# Patient Record
Sex: Male | Born: 1980 | Race: White | Hispanic: No | Marital: Single | Smoking: Former smoker
Health system: Southern US, Community
[De-identification: ages and names within clinical notes are randomized; demographics above are authoritative.]

## PROBLEM LIST (undated history)

## (undated) DIAGNOSIS — I10 Essential (primary) hypertension: Secondary | ICD-10-CM

## (undated) DIAGNOSIS — R569 Unspecified convulsions: Secondary | ICD-10-CM

## (undated) HISTORY — DX: Unspecified convulsions: R56.9

## (undated) HISTORY — PX: ROTATOR CUFF REPAIR: SHX139

## (undated) HISTORY — PX: SHOULDER SURGERY: SHX246

---
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
## 2002-09-14 ENCOUNTER — Encounter: Payer: Self-pay | Admitting: Emergency Medicine

## 2002-09-14 ENCOUNTER — Emergency Department (HOSPITAL_COMMUNITY): Admission: EM | Admit: 2002-09-14 | Discharge: 2002-09-14 | Payer: Self-pay | Admitting: Podiatry

## 2005-03-27 ENCOUNTER — Emergency Department (HOSPITAL_COMMUNITY): Admission: EM | Admit: 2005-03-27 | Discharge: 2005-03-27 | Payer: Self-pay | Admitting: Emergency Medicine

## 2006-01-22 ENCOUNTER — Emergency Department (HOSPITAL_COMMUNITY): Admission: EM | Admit: 2006-01-22 | Discharge: 2006-01-23 | Payer: Self-pay | Admitting: Emergency Medicine

## 2006-01-29 ENCOUNTER — Ambulatory Visit (HOSPITAL_COMMUNITY): Admission: RE | Admit: 2006-01-29 | Discharge: 2006-01-29 | Payer: Self-pay | Admitting: Specialist

## 2006-03-27 ENCOUNTER — Encounter: Admission: RE | Admit: 2006-03-27 | Discharge: 2006-05-18 | Payer: Self-pay | Admitting: Specialist

## 2011-06-10 ENCOUNTER — Emergency Department (HOSPITAL_COMMUNITY): Payer: Managed Care, Other (non HMO)

## 2011-06-10 ENCOUNTER — Emergency Department (HOSPITAL_COMMUNITY)
Admission: EM | Admit: 2011-06-10 | Discharge: 2011-06-10 | Disposition: A | Discharge status: Home / Self Care | Payer: Managed Care, Other (non HMO) | Attending: Emergency Medicine | Admitting: Emergency Medicine

## 2011-06-10 ENCOUNTER — Other Ambulatory Visit: Payer: Self-pay

## 2011-06-10 ENCOUNTER — Encounter (HOSPITAL_COMMUNITY): Payer: Self-pay

## 2011-06-10 DIAGNOSIS — I10 Essential (primary) hypertension: Secondary | ICD-10-CM | POA: Insufficient documentation

## 2011-06-10 DIAGNOSIS — R51 Headache: Secondary | ICD-10-CM | POA: Insufficient documentation

## 2011-06-10 DIAGNOSIS — R296 Repeated falls: Secondary | ICD-10-CM | POA: Insufficient documentation

## 2011-06-10 DIAGNOSIS — R42 Dizziness and giddiness: Secondary | ICD-10-CM | POA: Insufficient documentation

## 2011-06-10 DIAGNOSIS — S0990XA Unspecified injury of head, initial encounter: Secondary | ICD-10-CM

## 2011-06-10 DIAGNOSIS — Y9229 Other specified public building as the place of occurrence of the external cause: Secondary | ICD-10-CM | POA: Insufficient documentation

## 2011-06-10 DIAGNOSIS — S0100XA Unspecified open wound of scalp, initial encounter: Secondary | ICD-10-CM | POA: Insufficient documentation

## 2011-06-10 DIAGNOSIS — S0101XA Laceration without foreign body of scalp, initial encounter: Secondary | ICD-10-CM

## 2011-06-10 DIAGNOSIS — Y990 Civilian activity done for income or pay: Secondary | ICD-10-CM | POA: Insufficient documentation

## 2011-06-10 DIAGNOSIS — R55 Syncope and collapse: Secondary | ICD-10-CM

## 2011-06-10 DIAGNOSIS — M79609 Pain in unspecified limb: Secondary | ICD-10-CM | POA: Insufficient documentation

## 2011-06-10 HISTORY — DX: Essential (primary) hypertension: I10

## 2011-06-10 LAB — CK TOTAL AND CKMB (NOT AT ARMC): Relative Index: 1.7 (ref 0.0–2.5)

## 2011-06-10 LAB — DIFFERENTIAL
Basophils Relative: 0 % (ref 0–1)
Eosinophils Absolute: 0.2 10*3/uL (ref 0.0–0.7)
Lymphs Abs: 1.5 10*3/uL (ref 0.7–4.0)
Monocytes Absolute: 0.6 10*3/uL (ref 0.1–1.0)
Monocytes Relative: 5 % (ref 3–12)
Neutrophils Relative %: 82 % — ABNORMAL HIGH (ref 43–77)

## 2011-06-10 LAB — CBC
HCT: 42.8 % (ref 39.0–52.0)
Hemoglobin: 15.1 g/dL (ref 13.0–17.0)
MCH: 29.8 pg (ref 26.0–34.0)
MCHC: 35.3 g/dL (ref 30.0–36.0)
MCV: 84.4 fL (ref 78.0–100.0)
RBC: 5.07 MIL/uL (ref 4.22–5.81)

## 2011-06-10 LAB — BASIC METABOLIC PANEL
BUN: 15 mg/dL (ref 6–23)
Creatinine, Ser: 0.82 mg/dL (ref 0.50–1.35)
GFR calc non Af Amer: >90 mL/min (ref >90)
Glucose, Bld: 99 mg/dL (ref 70–99)
Potassium: 4.2 mEq/L (ref 3.5–5.1)

## 2011-06-10 LAB — D-DIMER, QUANTITATIVE: D-Dimer, Quant: <0.22 ug/mL-FEU (ref 0.00–0.48)

## 2011-06-10 LAB — GLUCOSE, CAPILLARY: Glucose-Capillary: 97 mg/dL (ref 70–99)

## 2011-06-10 LAB — RAPID URINE DRUG SCREEN, HOSP PERFORMED
Barbiturates: NOT DETECTED
Benzodiazepines: NOT DETECTED
Cocaine: NOT DETECTED
Tetrahydrocannabinol: NOT DETECTED

## 2011-06-10 MED ORDER — SODIUM CHLORIDE 0.9 % IV BOLUS (SEPSIS)
500.0000 mL | Freq: Once | INTRAVENOUS | Status: AC
  Administered 2011-06-10: 500 mL via INTRAVENOUS

## 2011-06-10 MED ORDER — OXYCODONE-ACETAMINOPHEN 5-325 MG PO TABS
2.0000 | ORAL_TABLET | Freq: Once | ORAL | Status: AC
  Administered 2011-06-10: 2 via ORAL
  Filled 2011-06-10: qty 2

## 2011-06-10 MED ORDER — TETANUS-DIPHTH-ACELL PERTUSSIS 5-2.5-18.5 LF-MCG/0.5 IM SUSP
0.5000 mL | Freq: Once | INTRAMUSCULAR | Status: AC
  Administered 2011-06-10: 0.5 mL via INTRAMUSCULAR
  Filled 2011-06-10 (×2): qty 0.5

## 2011-06-10 MED ORDER — HYDROCODONE-ACETAMINOPHEN 5-500 MG PO TABS
1.0000 | ORAL_TABLET | Freq: Four times a day (QID) | ORAL | Status: AC | PRN
Start: 2011-06-10 — End: 2011-06-20

## 2011-06-10 MED ORDER — FENTANYL CITRATE 0.05 MG/ML IJ SOLN
50.0000 ug | Freq: Once | INTRAMUSCULAR | Status: AC
  Administered 2011-06-10: 50 ug via INTRAVENOUS
  Filled 2011-06-10: qty 2

## 2011-06-10 NOTE — ED Provider Notes (Signed)
This patient was originally seen by Shickley PA. He is complaints of syncope, with head injury, and hand pain. Patient has been thoroughly worked up with CT head, CBC, BMP, sedimentation rate, x-rays, d-dimer. All which have returned with no acute findings. The patient hand pain is unexplainable. However, Raynauds, vasculitis, blood clot, brain injury, and other concerning differentials have been ruled out. The patient will be given pain medication and is to followup with his PCP. The patient's hand x-rays were negative as well. The mother and the patient is still very concerned about his hand pain. I have told him that he needs to followup with his primary care doctor and if he is still having symptoms his doctor can further refer him somewhere from there when he gets his staples taken out.  Dorthula Matas, PA 06/10/11 1645

## 2011-06-10 NOTE — ED Notes (Signed)
Pt brought in via EMS.  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  EMS reports laceration with a flap at back of head.  EMS reports that pt c/o some dizziness during EMS transport.

## 2011-06-10 NOTE — ED Provider Notes (Signed)
Medical screening examination/treatment/procedure(s) were conducted as a shared visit with non-physician practitioner(s) and myself.  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  C/o lightheadedness, flushed feeling, nausea.  No CP or SOB.  Laceration to occiput. EKG: no prolonged QT, no delta wave, no Brugada C/o bilateral hand pain: uncertain etiology.  +2 radial pulse, cap refill .<2s, strength nml. No bony deformity  Glynn Octave, MD 06/10/11 1747

## 2011-06-10 NOTE — ED Provider Notes (Signed)
History     CSN: [id number]  Arrival date & time 06/10/11  5621   First MD Initiated Contact with Patient 06/10/11 [phone]      Chief Complaint  Patient presents with  . 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 EMS reports that patient was found down on the floor of the freezer, unconscious by other employees. Per EMS patient was arousable by the other employees and was alert and oriented by their arrival. Patient states that he was standing in the freezer section and began to feel dizzy but that is the last thing he remembers before syncopizing. Patient denies history of similar events. EMS states the patient continued to complain of dizziness during EMS transport. Patient states he smokes half a pack of cigarettes a day and drinks 3-4 beers in the evening time. Patient denies recreational drug use. Patient is complaining of pain in posterior head with EMS reporting posterior scalp laceration. Patient is unsure of last tetanus. Patient denies visual changes, chest pain, neck pain, back pain, shortness of breath, extremity pain or injury, abdominal pain.  Past Medical History  Diagnosis Date  . Hypertension     Past Surgical History  Procedure Date  . Rotator cuff repair     No family history on file.  History  Substance Use Topics  . Smoking status: Current Everyday Smoker  . Smokeless tobacco: Not on file  . Alcohol Use: 8.4 oz/week    14 Cans of beer per week      Review of Systems  All other systems reviewed and are negative.    Allergies  Review of patient's allergies indicates no known allergies.  Home Medications  No current outpatient prescriptions on file.  BP 132/81  Pulse  73  Temp 98.7 F (37.1 C)  Resp 16  SpO2 95%  Physical Exam  Nursing note and vitals reviewed. Constitutional: He is oriented to person, place, and time. He appears well-developed and well-nourished. No distress. Cervical collar and backboard in place.  HENT:  Head: Normocephalic.       Four centimeter stellate laceration of lower posterior scalp. No underlying step-off.  Eyes: Conjunctivae and EOM are normal. Pupils are equal, round, and reactive to light.  Neck: Normal range of motion. Neck supple.  Cardiovascular: Normal rate, regular rhythm, normal heart sounds and intact distal pulses.  Exam reveals no gallop and no friction rub.   No murmur heard. Pulmonary/Chest: Effort normal and breath sounds normal. No respiratory distress. He has no wheezes. He has no rales. He exhibits no tenderness.  Abdominal: Bowel sounds are normal. He exhibits no distension and no mass. There is no tenderness. There is no rebound and no guarding.  Musculoskeletal: Normal range of motion. He exhibits tenderness. He exhibits no edema.       Tenderness to palpation across the entire dorsal aspect metacarpal joints of bilateral hands however no breaking skin, swelling, bruising, or deformity. Good radial pulse bilaterally and normal cap refill of all digits. Normal skin.  Full range of motion of bilateral upper and lower extremities without pain or difficulty with 5 out of 5 strength.  Entire spine nontender, pelvis stable.  Neurological: He is alert and oriented to person, place, and time. He has normal reflexes.  Skin:  Skin is warm and dry. No rash noted. He is not diaphoretic. No erythema.  Psychiatric: He has a normal mood and affect.    ED Course  Procedures (including critical care time)   IV fentanyl for pain  IM tetanus   Date: 06/10/2011  Rate: 85  Rhythm: normal sinus rhythm  QRS Axis: normal  Intervals: normal  ST/T Wave abnormalities: normal  Conduction Disutrbances:none  Narrative  Interpretation: inferior Q waves  Old EKG Reviewed: none available    LACERATION REPAIR Performed by: Jenness Corner Authorized by: Jenness Corner Consent: Verbal consent obtained. Risks and benefits: risks, benefits and alternatives were discussed Consent given by: patient Patient identity confirmed: provided demographic data Prepped and Draped in normal sterile fashion Wound explored  Laceration Location: lower posterior scalp  Laceration Length: 4cm. stellate  No Foreign Bodies seen or palpated  Anesthesia: local infiltration  Local anesthetic: lidocaine 2% with epinephrine  Anesthetic total: 8 ml  Irrigation method: syringe Amount of cleaning: standard  Skin closure: staples  Number of sutures: 7 staples  Technique: staples  Patient tolerance: Patient tolerated the procedure well with no immediate complications.  Labs Reviewed  CBC - Abnormal; Notable for the following:    WBC 13.0 (*)    All other components within normal limits  DIFFERENTIAL - Abnormal; Notable for the following:    Neutrophils Relative 82 (*)    Neutro Abs 10.7 (*)    All other components within normal limits  BASIC METABOLIC PANEL  ETHANOL  GLUCOSE, CAPILLARY  URINE RAPID DRUG SCREEN (HOSP PERFORMED)  POCT CBG MONITORING   No results found.   1. Syncope   2. Minor head injury   3. Scalp laceration     2:08 PM Patient is complaining of severe bilateral hand pain complaining of pain from "knuckles down" on both hands though there is no trauma to hands, good radial pulse bilaterally, normal cap refill and good grip strength. Unsure of origin of hand pain but no deformity or point specific TTP to suggest underlying fracture.   3:13 PM Given high degree of pain will order SED rate and continued to treat pain. Sign out given to Ellin Saba, physician assistant, you will continue to monitor labs with dispo pending patient's pain control and pending labs.  MDM  Patient is young and  healthy with no known medical problems with no origin of syncope within the ER. Negative chest x-ray and EKG and without suggestion of cardiac origin of syncope in no acute findings on head CT. Patient is alert and oriented with no neuro focal findings. EKG is not worrisome for underlying       Jenness Corner, PA 06/10/11 1513  Jenness Corner, PA 06/10/11 1521  Lenon Oms [place], [place] 06/10/11 1607

## 2011-06-21 NOTE — ED Provider Notes (Signed)
Evaluation and management procedures were performed by the PA/NP under my supervision/collaboration.    Felisa Bonier, MD 06/21/11 [phone]

## 2011-09-21 ENCOUNTER — Institutional Professional Consult (permissible substitution): Payer: Self-pay | Admitting: Cardiovascular Disease

## 2011-10-17 ENCOUNTER — Encounter: Payer: Self-pay | Admitting: *Deleted

## 2011-10-20 ENCOUNTER — Encounter: Payer: Self-pay | Admitting: Cardiovascular Disease

## 2011-10-20 ENCOUNTER — Ambulatory Visit (INDEPENDENT_AMBULATORY_CARE_PROVIDER_SITE_OTHER): Payer: 59 | Admitting: Cardiovascular Disease

## 2011-10-20 VITALS — BP 126/82 | HR 61 | Ht 71.5 in | Wt 173.8 lb

## 2011-10-20 DIAGNOSIS — R55 Syncope and collapse: Secondary | ICD-10-CM

## 2011-10-20 NOTE — Assessment & Plan Note (Addendum)
We are asked to see Ray Torres today for episodes of syncope. He had an episode of syncope back in January. He has seen a neurologist and has been diagnosed as having generalized seizures. He has been started on Keppra and is feeling better. He's not had any episodes of lightheadedness since that time.  His neurologist wanted to make sure that he had no cardiac issues.  We will place an event monitor on him. We will get an echocardiogram for evaluation of his left ventricular function. I'll see him on an as-needed basis.

## 2011-10-20 NOTE — Progress Notes (Signed)
    Ray Torres Date of Birth  July 14, 1980       [facility]    [place] [address]  [address] [place], [place]  [address]   [place], [place]  [address] [phone]     [phone]   Fax  [phone]    Fax [phone]  Problem List: Syncope  History of Present Illness:  Ray Torres is a 31 yo who present for evaluation of syncopal and pre syncopal episodes.  He works for [employer] in the [place] area. He loads and unloads cases of foods. He had an episode of syncope back in January while he was working.  He was cleared to go back to work and has now returned to his job. He has had some episodes of lightheadedness and presyncope. He's not had any further episodes of true syncope. He was seen by the neurologist and was thought to be having generalized seizures. He was sent to [place] to further evaluate his cardiac status.  He was started on Keppra and has not had any of the episodes of presyncope.  He's able to work very hard without any episodes of chest pain or shortness breath.    Current Outpatient Prescriptions on File Prior to Visit  Medication Sig Dispense Refill  . levETIRAcetam (KEPPRA) 500 MG tablet Take 500 mg by mouth 2 (two) times daily.        No Known Allergies  Past Medical History  Diagnosis Date  . Hypertension   . Seizures     Past Surgical History  Procedure Date  . Rotator cuff repair   . Shoulder surgery     both    History  Smoking status  . Current Everyday Smoker  Smokeless tobacco  . Not on file    History  Alcohol Use  . 8.4 oz/week  . 14 Cans of beer per week    History reviewed. No pertinent family history.  Reviw of Systems:  Reviewed in the HPI.  All other systems are negative.  Physical Exam: Blood pressure 126/82, pulse 61, height 5' 11.5" (1.816 m), weight 173 lb 12.8 oz (78.835 kg). General: Well developed, well nourished, in no acute distress.  Head:  Normocephalic, atraumatic, sclera non-icteric, mucus membranes are moist,   Neck: Supple. Carotids are 2 + without bruits. No JVD  Lungs: Clear bilaterally to auscultation.  Heart: regular rate.  normal  S1 S2. No murmurs, gallops or rubs.  Abdomen: Soft, non-tender, non-distended with normal bowel sounds. No hepatomegaly. No rebound/guarding. No masses.  Msk:  Strength and tone are normal  Extremities: No clubbing or cyanosis. No edema.  Distal pedal pulses are 2+ and equal bilaterally.  Neuro: Alert and oriented X 3. Moves all extremities spontaneously.  Psych:  Responds to questions appropriately with a normal affect.  ECG: 10/20/2011--normal sinus rhythm at 61 beats per minute. He has no ST or T wave changes.  Assessment / Plan:

## 2011-10-20 NOTE — Patient Instructions (Signed)
Your physician has recommended that you wear an event monitor. Event monitors are medical devices that record the heart's electrical activity. Doctors most often us these monitors to diagnose arrhythmias. Arrhythmias are problems with the speed or rhythm of the heartbeat. The monitor is a small, portable device. You can wear one while you do your normal daily activities. This is usually used to diagnose what is causing palpitations/syncope (passing out).  Your physician has requested that you have an echocardiogram. Echocardiography is a painless test that uses sound waves to create images of your heart. It provides your doctor with information about the size and shape of your heart and how well your heart's chambers and valves are working. This procedure takes approximately one hour. There are no restrictions for this procedure.  Your physician recommends that you schedule a follow-up appointment in: AS NEEDED BASIS    

## 2011-10-24 ENCOUNTER — Encounter (INDEPENDENT_AMBULATORY_CARE_PROVIDER_SITE_OTHER): Payer: 59

## 2011-10-24 ENCOUNTER — Other Ambulatory Visit: Payer: Self-pay

## 2011-10-24 ENCOUNTER — Ambulatory Visit (HOSPITAL_COMMUNITY): Payer: 59 | Attending: Cardiovascular Disease

## 2011-10-24 DIAGNOSIS — R55 Syncope and collapse: Secondary | ICD-10-CM | POA: Insufficient documentation

## 2011-10-24 DIAGNOSIS — R42 Dizziness and giddiness: Secondary | ICD-10-CM | POA: Insufficient documentation

## 2011-10-24 DIAGNOSIS — R569 Unspecified convulsions: Secondary | ICD-10-CM | POA: Insufficient documentation

## 2011-10-24 DIAGNOSIS — I10 Essential (primary) hypertension: Secondary | ICD-10-CM | POA: Insufficient documentation

## 2011-10-24 NOTE — Progress Notes (Signed)
Echocardiogram performed.  

## 2011-10-25 ENCOUNTER — Telehealth: Payer: Self-pay | Admitting: Cardiovascular Disease

## 2011-10-25 NOTE — Telephone Encounter (Signed)
Pt aware of echo results 

## 2011-10-25 NOTE — Telephone Encounter (Signed)
Fu call °Patient returning your call °

## 2011-11-28 ENCOUNTER — Telehealth: Payer: Self-pay | Admitting: *Deleted

## 2011-11-28 NOTE — Telephone Encounter (Signed)
msg left / holter NSR occasional episodes of sinus brady pt to call with questions number provided

## 2012-10-28 ENCOUNTER — Other Ambulatory Visit: Payer: Self-pay | Admitting: Diagnostic Neuroimaging

## 2012-10-30 ENCOUNTER — Encounter: Payer: Self-pay | Admitting: Diagnostic Neuroimaging

## 2012-10-30 ENCOUNTER — Ambulatory Visit (INDEPENDENT_AMBULATORY_CARE_PROVIDER_SITE_OTHER): Payer: Managed Care, Other (non HMO) | Admitting: Diagnostic Neuroimaging

## 2012-10-30 VITALS — BP 144/78 | HR 72 | Temp 98.5°F | Ht 72.0 in | Wt 156.0 lb

## 2012-10-30 DIAGNOSIS — G40909 Epilepsy, unspecified, not intractable, without status epilepticus: Secondary | ICD-10-CM

## 2012-10-30 MED ORDER — LEVETIRACETAM 500 MG PO TABS: 500.0000 mg | ORAL_TABLET | Freq: Two times a day (BID) | ORAL | Status: DC

## 2012-10-30 NOTE — Progress Notes (Signed)
[facility]  PATIENT: Ray Torres DOB: December 16, 1980  REFERRING CLINICIAN:  HISTORY FROM: patient  REASON FOR VISIT: follow up   HISTORICAL  CHIEF COMPLAINT:  Chief Complaint  Patient presents with  . Follow-up    HISTORY OF PRESENT ILLNESS:   UPDATE 10/30/12: since last visit, patient is doing well. No further seizures. Last seizure January 2013. Also since last visit, I have now started to take care of his twin brother for seizure disorder, previously treated by one of my former colleagues (Dr. Sandria Manly). Has slowed down on her and we and heat and in a   UPDATE 05/03/12:  Denies any seizure or passing out episodes.  Tolerating LEV 500mg  BID well.  He continues to drink 6-8 beers per day.  Has drank less in the last 2-3 days secondary to having a "cold".   UPDATE 01/02/12:  Back at work since August 6th.  After taking am medication has light headedness, he has been taking on empty stomach.  Has been feeling lightheaded after awakening for about 1-2 hours then resolves.  Tolerating LEV 500mg  BID.   Denies any episodes of seizures or passing out.    UPDATE 10/02/11:  Doing well.  Tolerating Lev 500mg  BID without drowsiness or nausea.  Reports taking his Felicity Coyer later in the day and felt light headed but did not pass out.  Reports having tingling to left chest and hand intermittently, daily, this was occuring prior to his medications.  He missed his appointment with the cardiologist and is rescheduled for May 31st.  Currently out of work, he drives a Scientist, clinical (histocompatibility and immunogenetics).  His short term/long term disability forms have been completed by [facility].  He continues to drink 4-8 alcoholic beverages per day.  Last reported episode with passing out was 06/19/11.  PRIOR HPI (Dr. Marjory Lies): 32 year old right-handed Caucasian male with no significant past medical history here for new onset dizziness and passing out episode. In January 2013 while at work he had an episode where he was unable  to comprehend information someone was telling him and he passed out, he hit the back of his head that required 7-8 staples.  He was unable to remember how long he was not responsive approximately one to 2 minutes. He reports his coworkers said he was trying to fight them. He did not know his birthday upon awakening. He denies any tongue biting, incontinence of bowel or bladder or convulsive activity.  Reports his hands were aching for about 2 days afterwards.  His mom reports he seen a while the emergency department. It took him to do 3 days before he felt better.One-week prior to this episode he had an upper respiratory infection. Denies lack of sleep, poor diet Denies any muscle aches or feeling tired. He also describes a second episode about one month later of feeling lightheaded and difficulty comprehending but did not pass out.   He was able to avoid  passing out. He has a history of alcohol and drug abuse, continues to drink approximately 4-8 beers per day. He denies interruptions of his drinking pattern. He reports he has frequent staring off spells but denies is able to respond with someone speaks to him.  He has had multiple deja vu episodes.  Denies shortness of breath, chest pain but has fast heart beat when he becomes anxious.  Sleeps approximately 7 hours per night.    His mother and his fraternal twin brother both have a history of epilepsy (grand mal seizures) at  the age of 30 years old.  REVIEW OF SYSTEMS: Full 14 system review of systems performed and notable only for nothing.  ALLERGIES: No Known Allergies  HOME MEDICATIONS: Outpatient Prescriptions Prior to Visit  Medication Sig Dispense Refill  . levETIRAcetam (KEPPRA) 500 MG tablet TAKE  (1)  TABLET TWICE A DAY.  180 tablet  1   No facility-administered medications prior to visit.    PAST MEDICAL HISTORY: Past Medical History  Diagnosis Date  . Hypertension   . Seizures     PAST SURGICAL HISTORY: Past Surgical History    Procedure Laterality Date  . Rotator cuff repair    . Shoulder surgery      both    FAMILY HISTORY: Family History  Problem Relation Age of Onset  . Epilepsy Mother   . Epilepsy Brother   . Alzheimer's disease      Grandfather  . Transient ischemic attack      Grandmother    SOCIAL HISTORY:  History   Social History  . Marital Status: Single    Spouse Name: N/A    Number of Children: 2  . Years of Education: HS   Occupational History  . Warehouse [employer]   Social History Main Topics  . Smoking status: Current Every Day Smoker -- 0.50 packs/day  . Smokeless tobacco: Never Used  . Alcohol Use: 8.4 oz/week    14 Cans of beer per week     Comment: 4-8 alcohol beverages daily  . Drug Use: No  . Sexually Active: Not on file   Other Topics Concern  . Not on file   Social History Narrative   Pt lives at home with his girlfriend.   Caffeine Use: very little     PHYSICAL EXAM  Filed Vitals:   10/30/12 1432  BP: 144/78  Pulse: 72  Temp: 98.5 F (36.9 C)  TempSrc: Oral  Height: 6' (1.829 m)  Weight: 156 lb (70.761 kg)    Not recorded    Body mass index is 21.15 kg/(m^2).  GENERAL EXAM: Patient is in no distress  CARDIOVASCULAR: Regular rate and rhythm, no murmurs, no carotid bruits  NEUROLOGIC: MENTAL STATUS: awake, alert, language fluent, comprehension intact, naming intact CRANIAL NERVE: no papilledema on fundoscopic exam, pupils equal and reactive to light, visual fields full to confrontation, extraocular muscles intact, no nystagmus, facial sensation and strength symmetric, uvula midline, shoulder shrug symmetric, tongue midline. MOTOR: MILD POSTURAL TREMOR. Normal bulk and tone, full strength in the BUE, BLE SENSORY: normal and symmetric to light touch COORDINATION: finger-nose-finger, fine finger movements normal REFLEXES: deep tendon reflexes present and symmetric GAIT/STATION: narrow based gait; able to walk tandem; romberg is  negative   DIAGNOSTIC DATA (LABS, IMAGING, TESTING) - I reviewed patient records, labs, notes, testing and imaging myself where available.  Lab Results  Component Value Date   WBC 13.0* 06/10/2011   HGB 15.1 06/10/2011   HCT 42.8 06/10/2011   MCV 84.4 06/10/2011   PLT 205 06/10/2011      Component Value Date/Time   NA 139 06/10/2011 1016   K 4.2 06/10/2011 1016   CL 103 06/10/2011 1016   CO2 25 06/10/2011 1016   GLUCOSE 99 06/10/2011 1016   BUN 15 06/10/2011 1016   CREATININE 0.82 06/10/2011 1016   CALCIUM 8.9 06/10/2011 1016   GFRNONAA >90 06/10/2011 1016   GFRAA >90 06/10/2011 1016   No results found for this basename: CHOL, HDL, LDLCALC, LDLDIRECT, TRIG, CHOLHDL   No results  found for this basename: HGBA1C   No results found for this basename: VITAMINB12   No results found for this basename: TSH   08/25/11 EEG - intermittent, generalized bifronatlly predominant spike in wave discharges.  Sometimes discharges are independent left or right frontally predominant and sometimes synchronized bifrontally.  No electrographic seizures are recorded.  09/06/11 MRI brain - normal  ASSESSMENT AND PLAN  32 y.o. 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 Last episode was 06/19/11. Neurological exam normal, except postural hand tremors left > right unclear if underlying anxiety, medications or alcohol related.    PLAN: - Continue Levitiracetam 500mg  BID - Advised again to reduce alcohol slowly; he is working on it   Suanne Marker, MD 10/30/2012, 2:47 PM Certified in Neurology, Neurophysiology and Neuroimaging  [facility] [address], [address] [place], [place] [address] [phone]

## 2012-10-30 NOTE — Patient Instructions (Signed)
Continue seizure medication

## 2013-03-06 ENCOUNTER — Encounter (INDEPENDENT_AMBULATORY_CARE_PROVIDER_SITE_OTHER): Payer: Self-pay

## 2013-03-06 ENCOUNTER — Encounter: Payer: Self-pay | Admitting: Family Medicine

## 2013-03-06 ENCOUNTER — Ambulatory Visit (INDEPENDENT_AMBULATORY_CARE_PROVIDER_SITE_OTHER): Payer: Managed Care, Other (non HMO) | Admitting: Family Medicine

## 2013-03-06 VITALS — BP 148/91 | HR 54 | Temp 98.0°F | Wt 161.0 lb

## 2013-03-06 DIAGNOSIS — J069 Acute upper respiratory infection, unspecified: Secondary | ICD-10-CM

## 2013-03-06 MED ORDER — AMOXICILLIN 875 MG PO TABS: 875.0000 mg | ORAL_TABLET | Freq: Two times a day (BID) | ORAL | Status: DC

## 2013-03-06 NOTE — Progress Notes (Signed)
  Subjective:    Patient ID: SIR MALLIS, male    DOB: [birth year], 32 y.o.   MRN: [id number]  HPI  This 32 y.o. male presents for evaluation of URI sx's for over a week. He has been having some sinus congestion.  Review of Systems C/o uri sx's   No chest pain, SOB, HA, dizziness, vision change, N/V, diarrhea, constipation, dysuria, urinary urgency or frequency, myalgias, arthralgias or rash.  Objective:   Physical Exam  Vital signs noted  Well developed well nourished male.  HEENT - Head atraumatic Normocephalic                Eyes - PERRLA, Conjuctiva - clear Sclera- Clear EOMI                Ears - EAC's Wnl TM's Wnl Gross Hearing WNL                Nose - Nares patent                 Throat - oropharanx wnl Respiratory - Lungs CTA bilateral Cardiac - RRR S1 and S2 without murmur GI - Abdomen soft Nontender and bowel sounds active x 4 Extremities - No edema. Neuro - Grossly intact.      Assessment & Plan:  URI (upper respiratory infection) - Plan: amoxicillin (AMOXIL) 875 MG tablet OTC cough and cold medicine as directed.  Push po fluids, rest, and follow up  prn  Deatra Canter FNP

## 2013-03-06 NOTE — Patient Instructions (Signed)

## 2013-05-13 IMAGING — CT CT HEAD W/O CM
1 series · 16 of 30 positions shown, 20 images · non-contrast
Comparison: None.

CLINICAL DATA: Syncope, laceration to back of head

CT HEAD WITHOUT CONTRAST
TECHNIQUE: Contiguous axial images were obtained from the base of
the skull through the vertex without contrast.

[Series 2: head routine 4.8 h37s · axial · 0.46mm/px · z∈[-150,-21]mm · 16 of 30 slices shown, 20 images]
[im 2/30  brain]
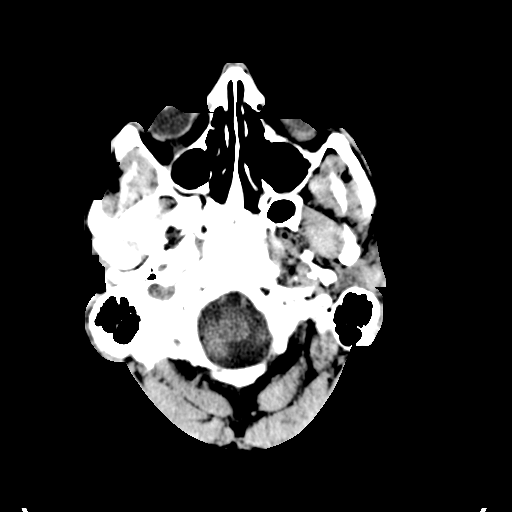
[im 2/30  bone]
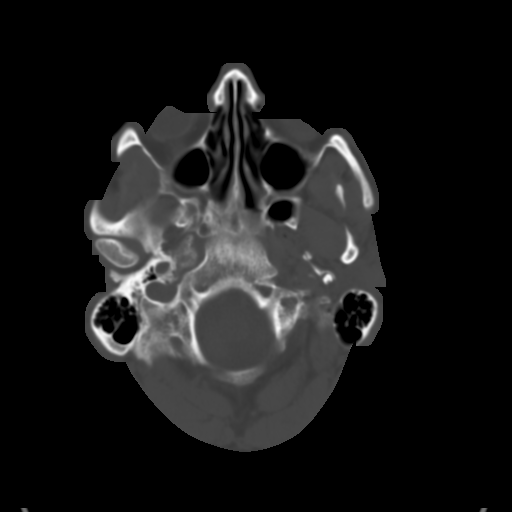
[im 4/30  brain]
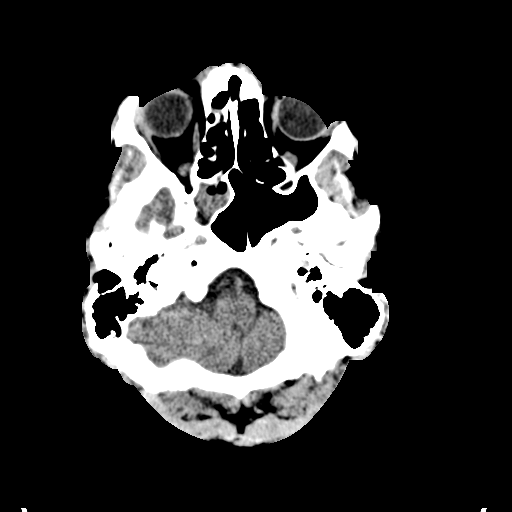
[im 6/30  brain]
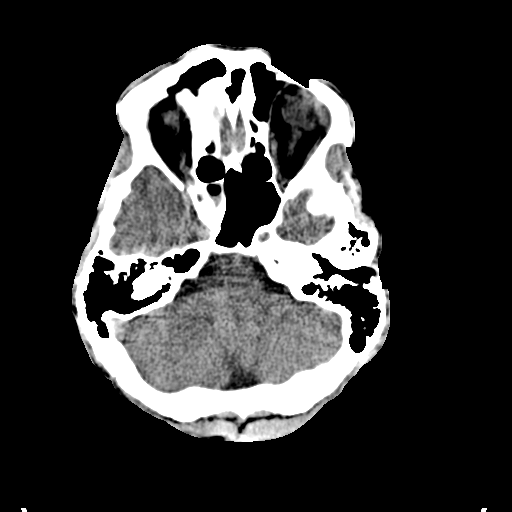
[im 8/30  brain]
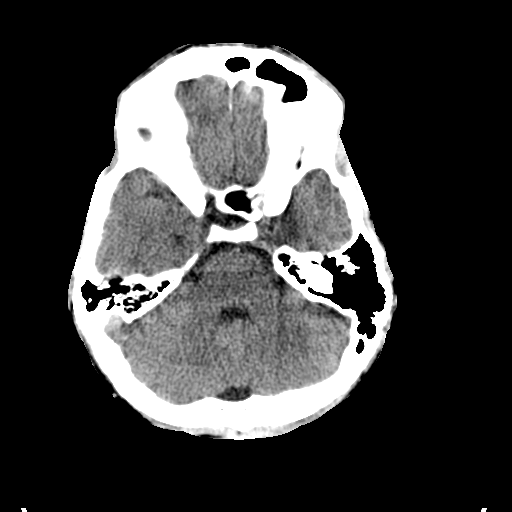
[im 9/30  brain]
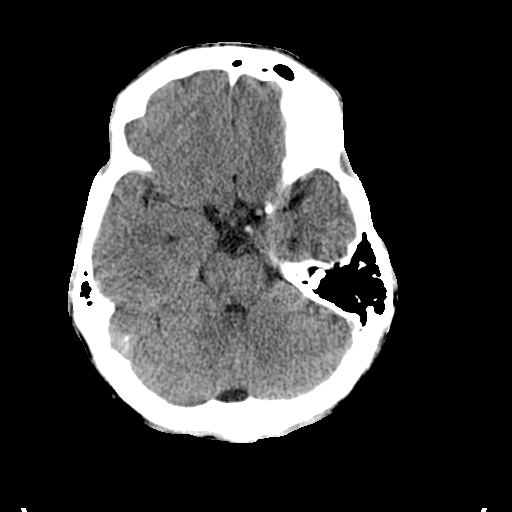
[im 9/30  bone]
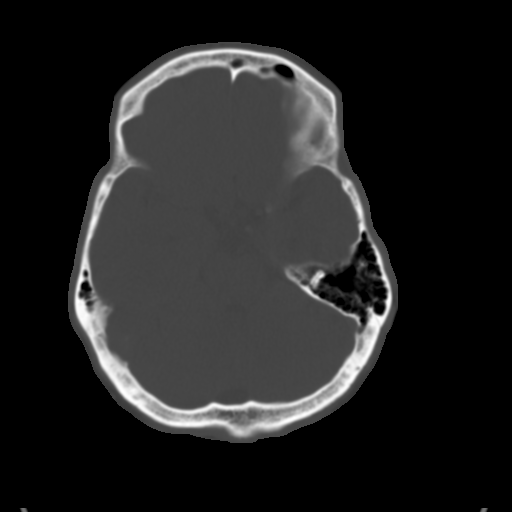
[im 11/30  brain]
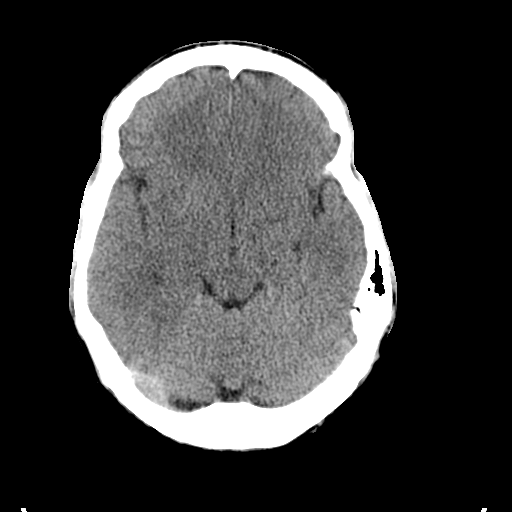
[im 13/30  brain]
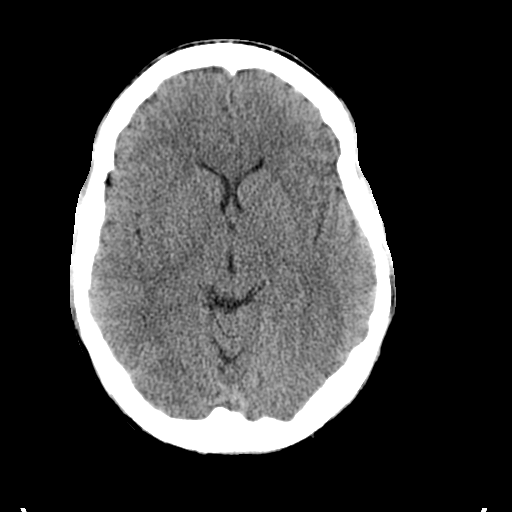
[im 15/30  brain]
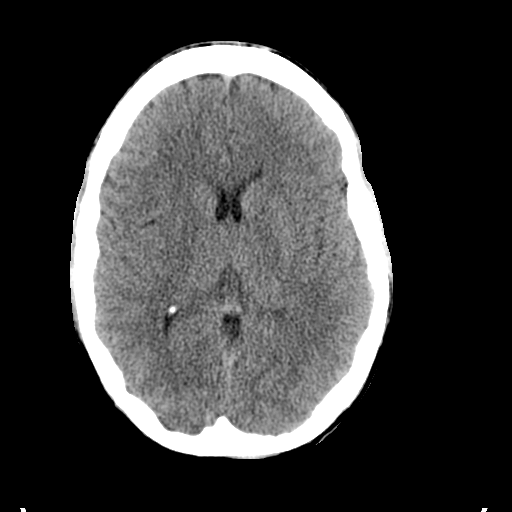
[im 16/30  brain]
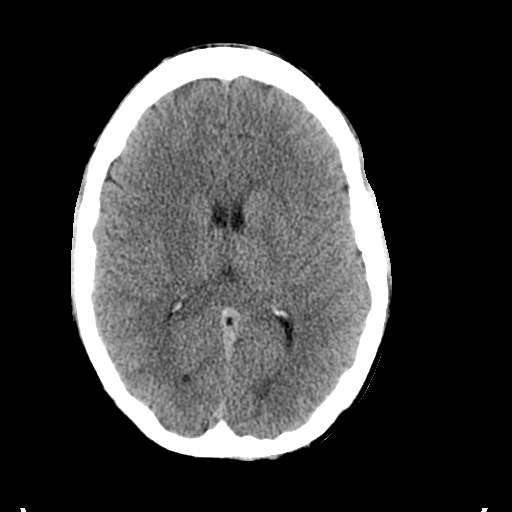
[im 16/30  bone]
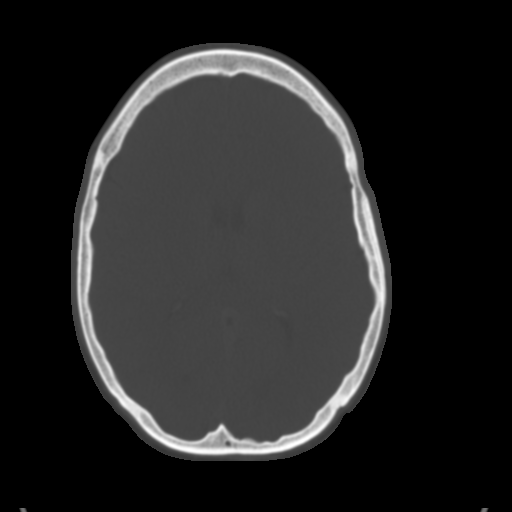
[im 18/30  brain]
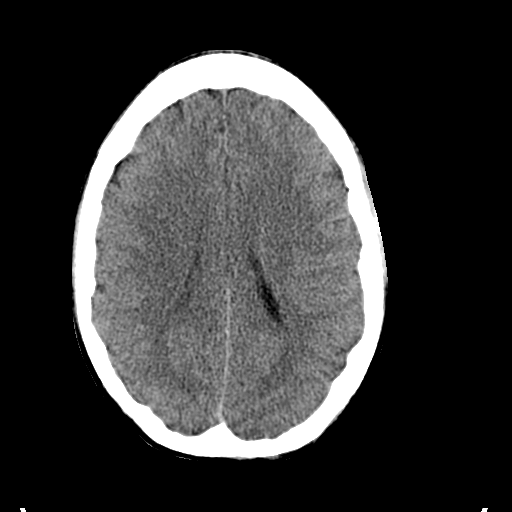
[im 20/30  brain]
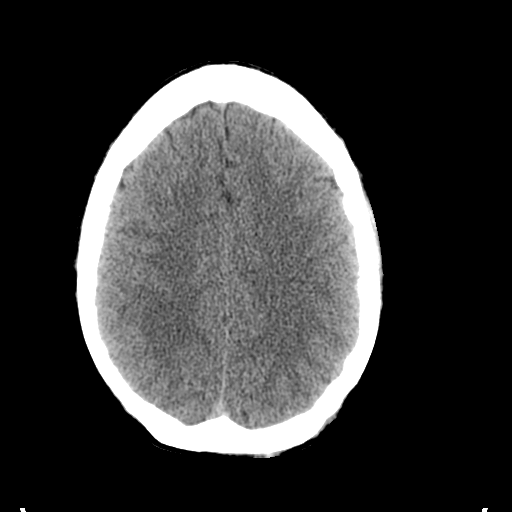
[im 22/30  brain]
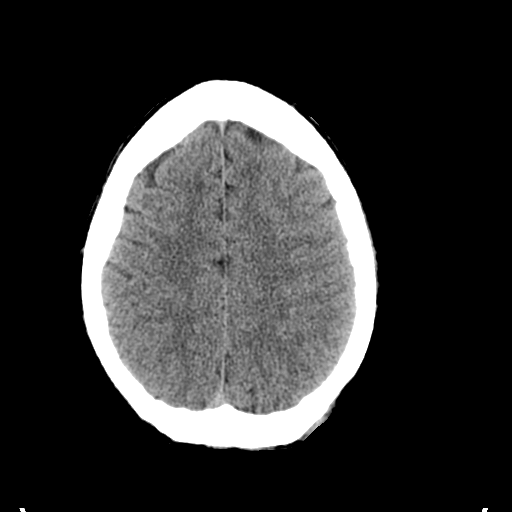
[im 23/30  brain]
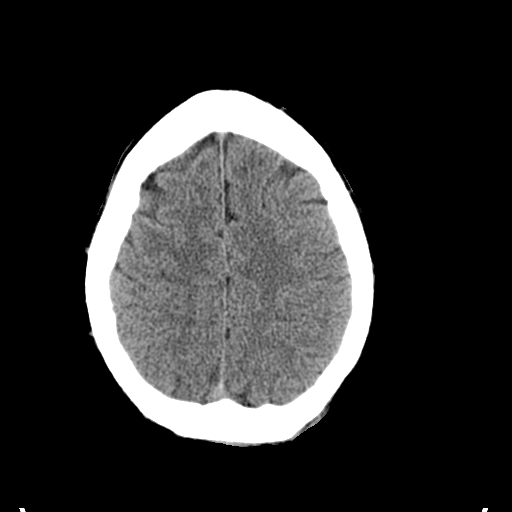
[im 23/30  bone]
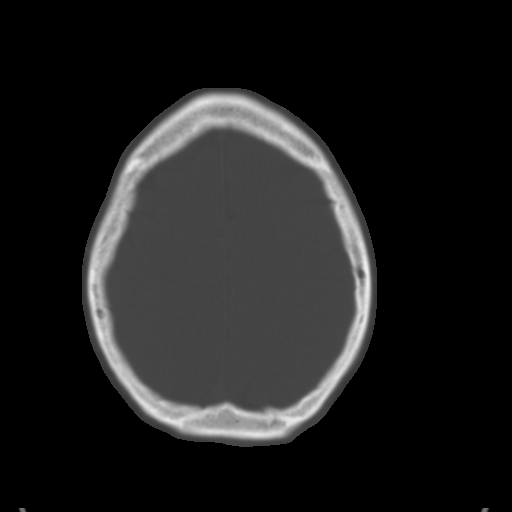
[im 25/30  brain]
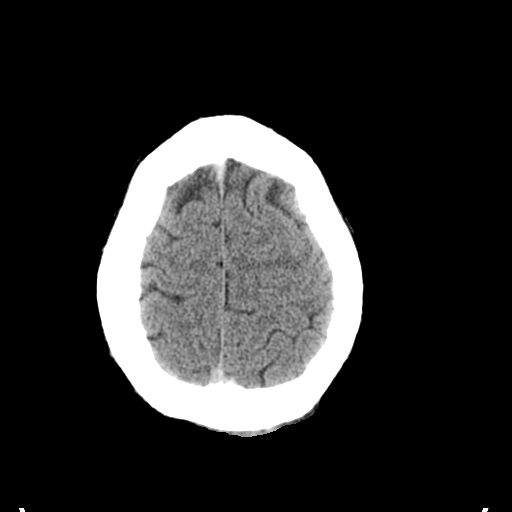
[im 27/30  brain]
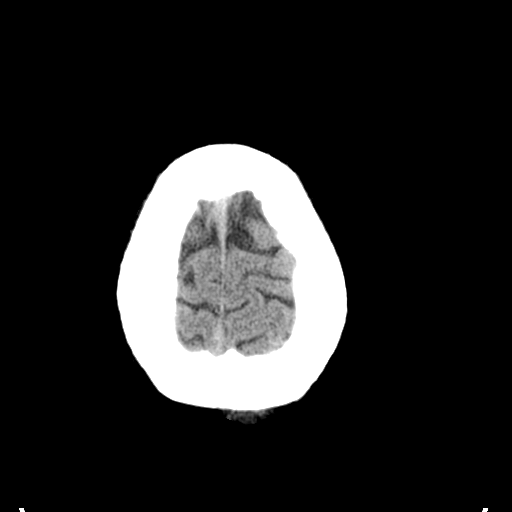
[im 29/30  brain]
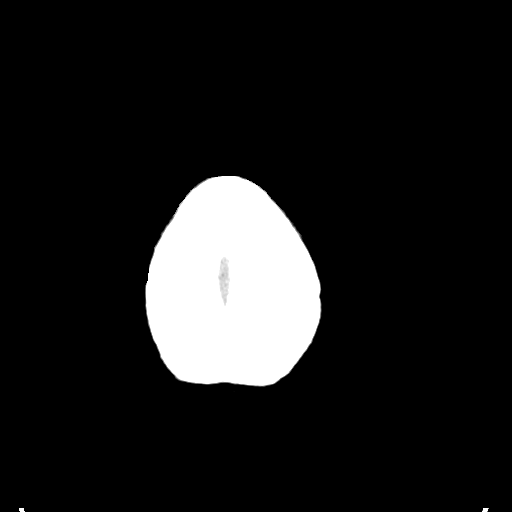

[16 of 30 positions shown; findings below may reference images not displayed]

FINDINGS: No evidence of parenchymal hemorrhage or extra-axial
fluid collection. No mass lesion, mass effect, or midline shift.

No CT evidence of acute infarction.

Cerebral volume is age appropriate.  No ventriculomegaly.

Opacification of the left sphenoid sinus.  Visualized paranasal
sinuses and mastoid air cells are otherwise clear.

Soft tissue swelling/laceration overlying the left posterior scalp.

No underlying osseous abnormality. No evidence of calvarial
fracture.
IMPRESSION: Soft tissue swelling/laceration overlying the left posterior scalp.

No evidence of calvarial fracture.

No evidence of acute intracranial abnormality.

## 2013-06-17 ENCOUNTER — Ambulatory Visit (INDEPENDENT_AMBULATORY_CARE_PROVIDER_SITE_OTHER): Payer: Managed Care, Other (non HMO) | Admitting: General Practice

## 2013-06-17 ENCOUNTER — Encounter (INDEPENDENT_AMBULATORY_CARE_PROVIDER_SITE_OTHER): Payer: Self-pay

## 2013-06-17 ENCOUNTER — Encounter: Payer: Self-pay | Admitting: General Practice

## 2013-06-17 VITALS — BP 133/89 | HR 64 | Temp 97.7°F | Ht 72.0 in | Wt 161.0 lb

## 2013-06-17 DIAGNOSIS — A088 Other specified intestinal infections: Secondary | ICD-10-CM

## 2013-06-17 DIAGNOSIS — A084 Viral intestinal infection, unspecified: Secondary | ICD-10-CM

## 2013-06-17 DIAGNOSIS — R0989 Other specified symptoms and signs involving the circulatory and respiratory systems: Secondary | ICD-10-CM

## 2013-06-17 LAB — POCT INFLUENZA A/B
INFLUENZA B, POC: NEGATIVE
Influenza A, POC: NEGATIVE

## 2013-06-17 NOTE — Patient Instructions (Signed)
Viral Gastroenteritis Viral gastroenteritis is also known as stomach flu. This condition affects the stomach and intestinal tract. It can cause sudden diarrhea and vomiting. The illness typically lasts 3 to 8 days. Most people develop an immune response that eventually gets rid of the virus. While this natural response develops, the virus can make you quite ill. CAUSES  Many different viruses can cause gastroenteritis, such as rotavirus or noroviruses. You can catch one of these viruses by consuming contaminated food or water. You may also catch a virus by sharing utensils or other personal items with an infected person or by touching a contaminated surface. SYMPTOMS  The most common symptoms are diarrhea and vomiting. These problems can cause a severe loss of body fluids (dehydration) and a body salt (electrolyte) imbalance. Other symptoms may include:  Fever.  Headache.  Fatigue.  Abdominal pain. DIAGNOSIS  Your caregiver can usually diagnose viral gastroenteritis based on your symptoms and a physical exam. A stool sample may also be taken to test for the presence of viruses or other infections. TREATMENT  This illness typically goes away on its own. Treatments are aimed at rehydration. The most serious cases of viral gastroenteritis involve vomiting so severely that you are not able to keep fluids down. In these cases, fluids must be given through an intravenous line (IV). HOME CARE INSTRUCTIONS   Drink enough fluids to keep your urine clear or pale yellow. Drink small amounts of fluids frequently and increase the amounts as tolerated.  Ask your caregiver for specific rehydration instructions.  Avoid:  Foods high in sugar.  Alcohol.  Carbonated drinks.  Tobacco.  Juice.  Caffeine drinks.  Extremely hot or cold fluids.  Fatty, greasy foods.  Too much intake of anything at one time.  Dairy products until 24 to 48 hours after diarrhea stops.  You may consume probiotics.  Probiotics are active cultures of beneficial bacteria. They may lessen the amount and number of diarrheal stools in adults. Probiotics can be found in yogurt with active cultures and in supplements.  Wash your hands well to avoid spreading the virus.  Only take over-the-counter or prescription medicines for pain, discomfort, or fever as directed by your caregiver. Do not give aspirin to children. Antidiarrheal medicines are not recommended.  Ask your caregiver if you should continue to take your regular prescribed and over-the-counter medicines.  Keep all follow-up appointments as directed by your caregiver. SEEK IMMEDIATE MEDICAL CARE IF:   You are unable to keep fluids down.  You do not urinate at least once every 6 to 8 hours.  You develop shortness of breath.  You notice blood in your stool or vomit. This may look like coffee grounds.  You have abdominal pain that increases or is concentrated in one small area (localized).  You have persistent vomiting or diarrhea.  You have a fever.  The patient is a child younger than 3 months, and he or she has a fever.  The patient is a child older than 3 months, and he or she has a fever and persistent symptoms.  The patient is a child older than 3 months, and he or she has a fever and symptoms suddenly get worse.  The patient is a baby, and he or she has no tears when crying. MAKE SURE YOU:   Understand these instructions.  Will watch your condition.  Will get help right away if you are not doing well or get worse. Document Released: 05/08/2005 Document Revised: 07/31/2011 Document Reviewed: 02/22/2011   ExitCare Patient Information 2014 [employer].  

## 2013-06-17 NOTE — Progress Notes (Signed)
   Subjective:    Patient ID: Ray Torres, male    DOB: May 22, 1981, 33 y.o.   MRN: [id number]  Emesis  This is a new problem. The current episode started yesterday. The problem occurs less than 2 times per day. The problem has been rapidly improving. There has been no fever. Associated symptoms include diarrhea and a fever. Pertinent negatives include no abdominal pain, chest pain, chills, coughing, dizziness, headaches, myalgias or weight loss. He has tried nothing for the symptoms.  Diarrhea  This is a new problem. The current episode started yesterday. The problem occurs 5 to 10 times per day. The problem has been rapidly improving. The patient states that diarrhea does not awaken him from sleep. Associated symptoms include a fever and vomiting. Pertinent negatives include no abdominal pain, chills, coughing, headaches, myalgias or weight loss. Nothing aggravates the symptoms. There are no known risk factors. He has tried nothing for the symptoms. There is no history of inflammatory bowel disease or irritable bowel syndrome.  Fever  This is a new problem. The current episode started yesterday. The problem occurs rarely. The problem has been rapidly improving. The maximum temperature noted was 99 to 99.9 F. The temperature was taken using an oral thermometer. Associated symptoms include diarrhea and vomiting. Pertinent negatives include no abdominal pain, chest pain, coughing, headaches or sore throat. He has tried nothing for the symptoms.      Review of Systems  Constitutional: Positive for fever. Negative for chills and weight loss.  HENT: Negative for sore throat.   Respiratory: Negative for cough and chest tightness.   Cardiovascular: Negative for chest pain and palpitations.  Gastrointestinal: Positive for vomiting and diarrhea. Negative for abdominal pain, constipation and blood in stool.  Genitourinary: Negative for difficulty urinating.  Musculoskeletal: Negative for myalgias.    Neurological: Negative for dizziness and headaches.       Objective:   Physical Exam  Constitutional: He appears well-developed and well-nourished.  Cardiovascular: Normal rate, regular rhythm and normal heart sounds.   Pulmonary/Chest: Effort normal and breath sounds normal. No respiratory distress. He exhibits no tenderness.  Abdominal: Soft. Bowel sounds are normal. He exhibits no distension. There is no tenderness.  Skin: Skin is warm and dry.  Psychiatric: He has a normal mood and affect.      Results for orders placed in visit on 06/17/13  POCT INFLUENZA A/B      Result Value Range   Influenza A, POC Negative     Influenza B, POC Negative         Assessment & Plan:  1. Chest congestion  - POCT Influenza A/B  2. Viral gastroenteritis -bland diet and rehydration discussed -RTO if s/s worsen or unresolved Patient verbalized understanding Coralie KeensMae E. Lamanda Rudder, FNP-C

## 2013-07-14 ENCOUNTER — Encounter: Payer: Self-pay | Admitting: Family Medicine

## 2013-07-14 ENCOUNTER — Ambulatory Visit (INDEPENDENT_AMBULATORY_CARE_PROVIDER_SITE_OTHER): Payer: Managed Care, Other (non HMO) | Admitting: Family Medicine

## 2013-07-14 VITALS — BP 133/76 | HR 70 | Temp 98.1°F | Ht 71.0 in | Wt 162.2 lb

## 2013-07-14 DIAGNOSIS — Z23 Encounter for immunization: Secondary | ICD-10-CM

## 2013-07-14 DIAGNOSIS — Z0000 Encounter for general adult medical examination without abnormal findings: Secondary | ICD-10-CM

## 2013-07-14 LAB — POCT CBC
Granulocyte percent: 57.3 %G (ref 37–80)
HCT, POC: 44.8 % (ref 43.5–53.7)
Hemoglobin: 14.7 g/dL (ref 14.1–18.1)
Lymph, poc: 1.8 (ref 0.6–3.4)
MCH, POC: 28.7 pg (ref 27–31.2)
MCHC: 32.7 g/dL (ref 31.8–35.4)
MCV: 87.6 fL (ref 80–97)
MPV: 7.3 fL (ref 0–99.8)
POC Granulocyte: 2.8 (ref 2–6.9)
POC LYMPH PERCENT: 36.4 %L (ref 10–50)
Platelet Count, POC: 221 10*3/uL (ref 142–424)
RBC: 5.1 M/uL (ref 4.69–6.13)
RDW, POC: 13.5 %
WBC: 4.9 10*3/uL (ref 4.6–10.2)

## 2013-07-14 NOTE — Progress Notes (Signed)
   Subjective:    Patient ID: Ray Torres, male    DOB: 1980/08/11, 33 y.o.   MRN: [id number]  HPI  This 34 y.o. male presents for evaluation of wellness visit.  He needs this to be done for His wife's insurance.  He is due for a CPE and labs.  Review of Systems No chest pain, SOB, HA, dizziness, vision change, N/V, diarrhea, constipation, dysuria, urinary urgency or frequency, myalgias, arthralgias or rash.     Objective:   Physical Exam Vital signs noted  Well developed well nourished male.  HEENT - Head atraumatic Normocephalic                Eyes - PERRLA, Conjuctiva - clear Sclera- Clear EOMI                Ears - EAC's Wnl TM's Wnl Gross Hearing WNL                Nose - Nares patent                 Throat - oropharanx wnl Respiratory - Lungs CTA bilateral Cardiac - RRR S1 and S2 without murmur GI - Abdomen soft Nontender and bowel sounds active x 4 Extremities - No edema. Neuro - Grossly intact.       Assessment & Plan:  Need for prophylactic vaccination and inoculation against influenza  Routine general medical examination at a health care facility - Plan: POCT CBC, CMP14+EGFR, Lipid panel, TSH  Lysbeth Penner FNP

## 2013-07-15 ENCOUNTER — Telehealth: Payer: Self-pay | Admitting: Family Medicine

## 2013-07-15 LAB — LIPID PANEL
Chol/HDL Ratio: 2 ratio units (ref 0.0–5.0)
Cholesterol, Total: 174 mg/dL (ref 100–199)
HDL: 87 mg/dL (ref >39)
LDL Calculated: 79 mg/dL (ref 0–99)
Triglycerides: 38 mg/dL (ref 0–149)
VLDL Cholesterol Cal: 8 mg/dL (ref 5–40)

## 2013-07-15 LAB — CMP14+EGFR
ALT: 17 IU/L (ref 0–44)
AST: 19 IU/L (ref 0–40)
Albumin/Globulin Ratio: 2.8 — ABNORMAL HIGH (ref 1.1–2.5)
Albumin: 4.7 g/dL (ref 3.5–5.5)
Alkaline Phosphatase: 76 IU/L (ref 39–117)
BUN/Creatinine Ratio: 21 — ABNORMAL HIGH (ref 8–19)
BUN: 17 mg/dL (ref 6–20)
CO2: 22 mmol/L (ref 18–29)
Calcium: 9.4 mg/dL (ref 8.7–10.2)
Chloride: 101 mmol/L (ref 97–108)
Creatinine, Ser: 0.81 mg/dL (ref 0.76–1.27)
GFR calc Af Amer: 138 mL/min/{1.73_m2} (ref >59)
GFR calc non Af Amer: 119 mL/min/{1.73_m2} (ref >59)
Globulin, Total: 1.7 g/dL (ref 1.5–4.5)
Glucose: 79 mg/dL (ref 65–99)
Potassium: 4.1 mmol/L (ref 3.5–5.2)
Sodium: 141 mmol/L (ref 134–144)
Total Bilirubin: 0.6 mg/dL (ref 0.0–1.2)
Total Protein: 6.4 g/dL (ref 6.0–8.5)

## 2013-07-15 LAB — TSH: TSH: 1.41 u[IU]/mL (ref 0.450–4.500)

## 2013-07-15 NOTE — Telephone Encounter (Signed)
Message copied by Azalee CourseFULP, ASHLEY on Tue Jul 15, 2013  3:42 PM ------      Message from: Deatra CanterXFORD, WILLIAM J      Created: Tue Jul 15, 2013  2:48 PM       Labs ok ------

## 2013-08-07 ENCOUNTER — Encounter: Payer: Self-pay | Admitting: Nurse Practitioner

## 2013-08-07 ENCOUNTER — Telehealth: Payer: Self-pay | Admitting: Family Medicine

## 2013-08-07 ENCOUNTER — Ambulatory Visit (INDEPENDENT_AMBULATORY_CARE_PROVIDER_SITE_OTHER): Payer: Managed Care, Other (non HMO) | Admitting: Nurse Practitioner

## 2013-08-07 ENCOUNTER — Ambulatory Visit (INDEPENDENT_AMBULATORY_CARE_PROVIDER_SITE_OTHER): Payer: Managed Care, Other (non HMO)

## 2013-08-07 VITALS — BP 137/82 | HR 88 | Temp 96.7°F | Ht 71.0 in | Wt 162.0 lb

## 2013-08-07 DIAGNOSIS — M25569 Pain in unspecified knee: Secondary | ICD-10-CM

## 2013-08-07 DIAGNOSIS — M25561 Pain in right knee: Secondary | ICD-10-CM

## 2013-08-07 MED ORDER — IBUPROFEN 800 MG PO TABS: 800.0000 mg | ORAL_TABLET | Freq: Three times a day (TID) | ORAL | Status: DC | PRN

## 2013-08-07 NOTE — Telephone Encounter (Signed)
APPT GIVEN FOR TODAY

## 2013-08-07 NOTE — Patient Instructions (Signed)
Knee Pain Knee pain can be a result of an injury or other medical conditions. Treatment will depend on the cause of your pain. HOME CARE  Only take medicine as told by your doctor.  Keep a healthy weight. Being overweight can make the knee hurt more.  Stretch before exercising or playing sports.  If there is constant knee pain, change the way you exercise. Ask your doctor for advice.  Make sure shoes fit well. Choose the right shoe for the sport or activity.  Protect your knees. Wear kneepads if needed.  Rest when you are tired. GET HELP RIGHT AWAY IF:   Your knee pain does not stop.  Your knee pain does not get better.  Your knee joint feels hot to the touch.  You have a fever. MAKE SURE YOU:   Understand these instructions.  Will watch this condition.  Will get help right away if you are not doing well or get worse. Document Released: 08/04/2008 Document Revised: 07/31/2011 Document Reviewed: 08/04/2008 [employer] Patient Information 2014 [employer].  

## 2013-08-07 NOTE — Progress Notes (Signed)
   Subjective:    Patient ID: Ray Torres, male    DOB: 17-Apr-1981, 33 y.o.   MRN: [id number]  HPI  Patient in today c/o right knee pain- started last night prior to going to bed- said he was just sitting in recliner when it started. Painful to walk on this morning. Denies any injury.    Review of Systems  Respiratory: Negative.   Cardiovascular: Negative.   All other systems reviewed and are negative.       Objective:   Physical Exam  Constitutional: He appears well-developed and well-nourished.  Cardiovascular: Normal rate, regular rhythm and normal heart sounds.   Pulmonary/Chest: Effort normal and breath sounds normal.  Musculoskeletal: He exhibits no edema.  No right knee effusion-FROM without pain- no point tenderness- no crepitis- no patella tenderness   BP 137/82  Pulse 88  Temp(Src) 96.7 F (35.9 C) (Oral)  Ht 5\' 11"  (1.803 m)  Wt 162 lb (73.483 kg)  BMI 22.60 kg/m2   Right knee x ray-mild early degenerative changes, otherwise normal- Preliminary reading by Paulene FloorMary Quyen Cutsforth, FNP  [facility]      Assessment & Plan:   1. Knee pain, right    Meds ordered this encounter  Medications  . ibuprofen (ADVIL,MOTRIN) 800 MG tablet    Sig: Take 1 tablet (800 mg total) by mouth every 8 (eight) hours as needed.    Dispense:  40 tablet    Refill:  1    Order Specific Question:  Supervising Provider    Answer:  Ernestina PennaMOORE, DONALD W [[id number]]   Rest No deep knee bends Ice if helps  RTO prn  Mary-Margaret Daphine DeutscherMartin, FNP

## 2013-11-03 ENCOUNTER — Telehealth: Payer: Self-pay | Admitting: Nurse Practitioner

## 2013-11-03 ENCOUNTER — Ambulatory Visit: Payer: Managed Care, Other (non HMO) | Admitting: Nurse Practitioner

## 2013-11-03 NOTE — Telephone Encounter (Signed)
Patient was no show for today's office appointment.  

## 2013-11-20 ENCOUNTER — Other Ambulatory Visit: Payer: Self-pay | Admitting: Diagnostic Neuroimaging

## 2013-11-24 ENCOUNTER — Telehealth: Payer: Self-pay | Admitting: Diagnostic Neuroimaging

## 2013-11-24 MED ORDER — LEVETIRACETAM 500 MG PO TABS: 500.0000 mg | ORAL_TABLET | Freq: Two times a day (BID) | ORAL | Status: DC

## 2013-11-24 NOTE — Telephone Encounter (Signed)
Rx has been sent to last until appt.   

## 2013-11-24 NOTE — Telephone Encounter (Signed)
Patient requesting refill of Keppra, has already scheduled a yearly visit coming up on 12/01/13 with Larita FifeLynn.

## 2013-12-01 ENCOUNTER — Encounter: Payer: Self-pay | Admitting: Nurse Practitioner

## 2013-12-01 ENCOUNTER — Ambulatory Visit (INDEPENDENT_AMBULATORY_CARE_PROVIDER_SITE_OTHER): Payer: Managed Care, Other (non HMO) | Admitting: Nurse Practitioner

## 2013-12-01 VITALS — BP 149/90 | HR 66 | Wt 156.8 lb

## 2013-12-01 DIAGNOSIS — G40909 Epilepsy, unspecified, not intractable, without status epilepticus: Secondary | ICD-10-CM

## 2013-12-01 MED ORDER — LEVETIRACETAM 500 MG PO TABS: 500.0000 mg | ORAL_TABLET | Freq: Two times a day (BID) | ORAL | Status: DC

## 2013-12-01 NOTE — Progress Notes (Signed)
PATIENT: Ray CollegeMichael D Torres DOB: 15-May-1981  REASON FOR VISIT: routine follow up for seizures HISTORY FROM: patient  HISTORY OF PRESENT ILLNESS: UPDATE 12/01/13 (LL): since last visit, no further seizures.  Tolerating LEV 500 mg bid well. No complaints.  UPDATE 10/30/12: since last visit, patient is doing well. No further seizures. Last seizure January 2013. Also since last visit, I have now started to take care of his twin brother for seizure disorder, previously treated by one of my former colleagues (Dr. Sandria ManlyLove).  UPDATE 05/03/12: Denies any seizure or passing out episodes. Tolerating LEV 500mg  BID well. He continues to drink 6-8 beers per day. Has drank less in the last 2-3 days secondary to having a "cold".  UPDATE 01/02/12: Back at work since August 6th. After taking am medication has light headedness, he has been taking on empty stomach. Has been feeling lightheaded after awakening for about 1-2 hours then resolves. Tolerating LEV 500mg  BID.  Denies any episodes of seizures or passing out.  UPDATE 10/02/11: Doing well. Tolerating Lev 500mg  BID without drowsiness or nausea. Reports taking his Felicity CoyerLev later in the day and felt light headed but did not pass out. Reports having tingling to left chest and hand intermittently, daily, this was occuring prior to his medications. He missed his appointment with the cardiologist and is rescheduled for May 31st. 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 His short term/long term disability forms have been completed by [facility]. He continues to drink 4-8 alcoholic beverages per day. Last reported episode with passing out was 06/19/11.  PRIOR HPI (Dr. Marjory LiesPenumalli): 33 year old right-handed Caucasian male with no significant past medical history here for new onset dizziness and passing out episode. In January 2013 while at work he had an episode where he was unable to comprehend information someone was telling him and he passed out, he hit the back of his head  that required 7-8 staples. He was unable to remember how long he was not responsive approximately one to 2 minutes. He reports his coworkers said he was trying to fight them. He did not know his birthday upon awakening. He denies any tongue biting, incontinence of bowel or bladder or convulsive activity. Reports his hands were aching for about 2 days afterwards. His mom reports he seen a while the emergency department. It took him to do 3 days before he felt better.One-week prior to this episode he had an upper respiratory infection. Denies lack of sleep, poor diet Denies any muscle aches or feeling tired. He also describes a second episode about one month later of feeling lightheaded and difficulty comprehending but did not pass out. He was able to avoid passing out. He has a history of alcohol and drug abuse, continues to drink approximately 4-8 beers per day. He denies interruptions of his drinking pattern. He reports he has frequent staring off spells but denies is able to respond with someone speaks to him. He has had multiple deja vu episodes. Denies shortness of breath, chest pain but has fast heart beat when he becomes anxious. Sleeps approximately 7 hours per night.  His mother and his fraternal twin brother both have a history of epilepsy (grand mal seizures) at the age of 31 years old.   REVIEW OF SYSTEMS: Full 14 system review of systems performed and notable only for nothing.   ALLERGIES: No Known Allergies  HOME MEDICATIONS: Outpatient Prescriptions Prior to Visit  Medication Sig Dispense Refill  . levETIRAcetam (KEPPRA) 500 MG tablet Take  1 tablet (500 mg total) by mouth 2 (two) times daily.  60 tablet  0  . ibuprofen (ADVIL,MOTRIN) 800 MG tablet Take 1 tablet (800 mg total) by mouth every 8 (eight) hours as needed.  40 tablet  1   No facility-administered medications prior to visit.    PHYSICAL EXAM Filed Vitals:   12/01/13 1019  BP: 149/90  Pulse: 66  Weight: 156 lb 12.8 oz  (71.124 kg)   Body mass index is 21.88 kg/(m^2). No exam data present No flowsheet data found.  No flowsheet data found.   Generalized: Well developed, in no acute distress  Head: normocephalic and atraumatic. Oropharynx benign  Neck: Supple, no carotid bruits  Cardiac: Regular rate rhythm, no murmur  Musculoskeletal: No deformity   Neurological examination  Mentation: Alert oriented to time, place, history taking. Follows all commands speech and language fluent Cranial nerve II-XII: Fundoscopic exam not done. Pupils were equal round reactive to light extraocular movements were full, visual field were full on confrontational test. Facial sensation and strength were normal. hearing was intact to finger rubbing bilaterally. Uvula tongue midline. head turning and shoulder shrug and were normal and symmetric.Tongue protrusion into cheek strength was normal. Motor: The motor testing reveals 5 over 5 strength of all 4 extremities. Good symmetric motor tone is noted throughout. MILD POSTURAL TREMOR.  Sensory: Sensory testing is intact to soft touch on all 4 extremities. No evidence of extinction is noted.  Coordination: Cerebellar testing reveals good finger-nose-finger and heel-to-shin bilaterally.  Gait and station: Gait is normal. Tandem gait is normal. Romberg is negative. Reflexes: Deep tendon reflexes are symmetric and normal bilaterally.   08/25/11 EEG - intermittent, generalized bifronatlly predominant spike in wave discharges. Sometimes discharges are independent left or right frontally predominant and sometimes synchronized bifrontally. No electrographic seizures are recorded.  09/06/11 MRI brain - normal   ASSESSMENT AND PLAN: 33 y.o. 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 Last episode was 06/19/11. Neurological exam normal, except postural hand tremors left > right unclear if underlying anxiety, medications or alcohol related.   - Continue Levitiracetam 500mg  BID  -  Seizure precautions reviewed.  Meds ordered this encounter  Medications  . levETIRAcetam (KEPPRA) 500 MG tablet    Sig: Take 1 tablet (500 mg total) by mouth 2 (two) times daily.    Dispense:  180 tablet    Refill:  3    Order Specific Question:  Supervising Provider    Answer:  Suanne Marker [[id number]]   Return in about 1 year (around 12/02/2014) for seizure follow up.  Ronal Fear, MSN, NP-C 12/01/2013, 10:31 AM [facility] [address], [address] [place], [place] [address] [phone]  Note: This document was prepared with digital dictation and possible smart phrase technology. Any transcriptional errors that result from this process are unintentional.

## 2013-12-01 NOTE — Patient Instructions (Addendum)
Plan: - Continue Levitiracetam 500mg  BID  - Follow up in 1 year, sooner as needed.   Managing Seizure Triggers: Tips for Lifestyle Modification Adapted from the [facility], [facility], [place], [place] and the journal "Clinical Nursing Practice in Epilepsy", Spring 1994.  Developing plans to modify your lifestyle is an important part of seizure preparedness. It's a way that you, as a person with seizures or a parent of a child with seizures, can take charge and play an active role in your epilepsy care. The following tips are examples of what people can do to manage triggers. Some of these tips may require a change in behavior, others may be ways to adjust your environment or schedule so not everything happens at once. Before choosing tips to try, make sure you've assessed your situation and talked to your doctor and other health care professionals for their suggestions too. Please note that research on the effectiveness of many of these techniques is limited. Many of these tips are common sense suggestions or are from health care professionals and people with epilepsy as to what they have seen and tried.  Noises: People who think they are affected by noises should be sure to talk to their doctor about whether they have a form of 'reflex epilepsy' or if general noise or distraction may be a trigger in another way. People with true reflex epilepsy may respond to specific seizure medicines and should talk to their doctor. Try using earplugs or earphones, especially in noisy or crowded places. Try listening to relaxing music or sounds, or try distracting yourself by singing or focusing on another activity.  Bright, flashing or fluorescent lights: Use polarized or tinted glasses. Use natural lighting when indoors. Focus on distant objects when riding in a car to avoid flickering lights or patterns. Avoid discos, strobe lights or flashing bulbs on  holiday decorations. Use computer monitor with minimal contrast glare or use a screen filter. Consult with your doctor about other specific recommendations for computer use.  Sleep: Try to regulate sleeping habits so you have a consistent schedule and get enough sleep. Keep a log or diary of your sleep patterns, seizures and general well-being. Ask a partner or companion to record his or her observations too. Consider the following ideas to improve sleep.  . Discuss your medicine schedule with your doctor or nurse. Changing times or doses at night may help sleep. . Limit caffeine and try to avoid it after noon time or mid?afternoon at the latest. . Avoid alcohol and nicotine prior to sleep. . Limit working or studying late at night. Stop work at least one hour before bedtime to allow time to relax. . Exercise in the early evening if possible. . Take warm showers or have someone give you a back rub before bedtime to decrease muscle tension. . Try relaxation exercises before bedtime. . Limit naps and don't nap in the early evening. . If anxious or worried, talk to someone or write down your feelings before going to sleep. Put this away and deal with these worries or concerns in the morning! . If you can't fall asleep within 15 minutes get up and do something else for 15 minutes. Then go back to bed and try again. Don't toss and turn in bed all night.  Exercise: Regular exercise is good for everyone. Pace your exercise to avoid getting too tired or hyperventilation. Avoid exercising in the middle of the day during hot weather. Ask your doctor about  any specific exercises you may need to avoid.  Hyperventilation: Try relaxation or slow breathing exercises when anxious or if you begin to hyperventilate. Pace your activity and avoid sports that may trigger hyperventilation.  Diet: Regulate meal times and patterns around sleep, activity, and medication schedules. Usually taking  medicines after food or around meals makes it easier to remember them and may lessen any stomach distress from side effects of medicines. Have a well-balanced diet and eat at consistent times to avoid long periods without food. If your appetite is poor, try small frequent meals instead of skipping meals. Avoid foods and drinks that may aggravate seizures. Not everyone is sensitive to foods, but if you are, talk to your doctor about how to modify your diet. If you are following a diet specifically for your epilepsy, be sure to follow the advice of your doctor and nutritionist.  Alcohol/Drugs: Avoid recreational drugs and talk to your doctor about use of alcohol. Avoid alcohol completely if you're going through high-risk times or have recently had surgery. If you choose to drink alcohol, use 'moderation', drink slowly, and have only one or two glasses at a time. Consider carefully what you drink, avoiding 'hard liquor' or mixed drinks that may have high alcohol content. If alcohol and drugs are a problem for you, talk to your doctor and get professional help.  Hormonal changes: Both men and women may notice a cyclical pattern to their seizures. Record seizures on a calendar and track them in relation to any changes in hormones. Women who are having menstrual cycles should track their cycle days. Women who have stopped having their menses should track other symptoms or changes, while women who are pregnant should track their pregnancy too. The use of hormonal medicines, such as contraceptives or birth control pills as well as hormonal replacement therapy, may affect seizures in some women, so record the dates and doses of these medicines.  NOTE: some seizure medicines may interfere with the effectiveness of hormonal contraceptives making unexpected or unplanned pregnancy more likely. Be sure to talk to your doctor about all contraceptive use.When seizures cluster around menses or hormone changes, women  should try to modify their lifestyle so other triggers don't occur during this high-risk time. Some women may use 'as needed' medicines to help treat seizures associated with menses. Note the use of these on your calendars and seizure preparedness plan.  Illness, fever, trauma: Notify your doctor if you become ill, have a fever, injure yourself seriously, or need other medicines such as antibiotics, painkillers, or cold medicines. Some people may notice that certain medicines can trigger seizures or interfere with seizure medicines. Fevers, other illnesses and injuries may also make you more susceptible and you'll need to monitor your seizures carefully. Try to limit other triggers during these times and talk to your doctor about what medicines you can use.  Stress, anxiety, depression: Emotional stress is a common trigger for some people, and stress can be a cause and symptom of mood problems such as anxiety and depression. Track your stress level and mood in relation to your seizures on your diary. During stressful times, consider ways to modify your lifestyle and manage stress better. . Try counseling to help cope with seizures or other problems. . Consider support groups for epilepsy, or groups for stress management, therapy, and other support. . Write down feelings in a diary on a regular basis. It helps you get feelings out, rather than hold them in, and can help you see  the issues more clearly. . Use 'time-out' periods. Just like kids may need a time-out when they are overwhelmed or acting out, so too do adults. Giving yourself a time-out allows you to take a step back from the stressor or situation and think about how best to address it. . Learn relaxation exercises, deep breathing, yoga, or other strategies that help with stress and general well-being. . Tell your doctor and nurse how you feel. The effects of stress can be harmful to your seizures, and your life. When mood changes  last longer than expected, you may need help from a mental health professional too. If you feel emotionally unsafe, call your doctor or go to an emergency room to be evaluated.

## 2013-12-12 NOTE — Progress Notes (Signed)
I reviewed note and agree with plan.   VIKRAM R. PENUMALLI, MD  Certified in Neurology, Neurophysiology and Neuroimaging  [facility] [address], [address] [place], [place] [address] [phone]   

## 2014-01-08 ENCOUNTER — Ambulatory Visit: Payer: Self-pay

## 2014-01-08 ENCOUNTER — Other Ambulatory Visit: Payer: Self-pay | Admitting: Occupational Medicine

## 2014-01-08 DIAGNOSIS — Z0000 Encounter for general adult medical examination without abnormal findings: Secondary | ICD-10-CM

## 2014-04-08 ENCOUNTER — Encounter: Payer: Self-pay | Admitting: Diagnostic Neuroimaging

## 2014-04-29 ENCOUNTER — Telehealth: Payer: Self-pay | Admitting: Family Medicine

## 2014-04-29 DIAGNOSIS — Z3009 Encounter for other general counseling and advice on contraception: Secondary | ICD-10-CM

## 2014-04-29 NOTE — Telephone Encounter (Signed)
Patient aware, referral was done.

## 2014-12-02 ENCOUNTER — Ambulatory Visit: Payer: Managed Care, Other (non HMO) | Admitting: Diagnostic Neuroimaging

## 2014-12-02 ENCOUNTER — Ambulatory Visit: Payer: Managed Care, Other (non HMO) | Admitting: Nurse Practitioner

## 2014-12-17 ENCOUNTER — Other Ambulatory Visit: Payer: Self-pay

## 2014-12-17 MED ORDER — LEVETIRACETAM 500 MG PO TABS: 500.0000 mg | ORAL_TABLET | Freq: Two times a day (BID) | ORAL | Status: DC

## 2014-12-23 ENCOUNTER — Ambulatory Visit (INDEPENDENT_AMBULATORY_CARE_PROVIDER_SITE_OTHER): Payer: BLUE CROSS/BLUE SHIELD | Admitting: Diagnostic Neuroimaging

## 2014-12-23 ENCOUNTER — Encounter: Payer: Self-pay | Admitting: Diagnostic Neuroimaging

## 2014-12-23 VITALS — BP 137/89 | HR 70 | Ht 71.0 in | Wt 174.0 lb

## 2014-12-23 DIAGNOSIS — R030 Elevated blood-pressure reading, without diagnosis of hypertension: Secondary | ICD-10-CM

## 2014-12-23 DIAGNOSIS — G40309 Generalized idiopathic epilepsy and epileptic syndromes, not intractable, without status epilepticus: Secondary | ICD-10-CM

## 2014-12-23 DIAGNOSIS — IMO0001 Reserved for inherently not codable concepts without codable children: Secondary | ICD-10-CM

## 2014-12-23 MED ORDER — LEVETIRACETAM 500 MG PO TABS: 500.0000 mg | ORAL_TABLET | Freq: Two times a day (BID) | ORAL | Status: DC

## 2014-12-23 NOTE — Patient Instructions (Signed)
-   continue Levitiracetam  BID  - seizure precautions reviewed - monitor BP (slightly elevated on last 3 readings); encouraged better diet, physical activity, smoking cessation and PCP follow up

## 2014-12-23 NOTE — Progress Notes (Signed)
[facility]  PATIENT: Ray Torres DOB: 1980-07-06  REFERRING CLINICIAN:  HISTORY FROM: patient  REASON FOR VISIT: follow up   HISTORICAL  CHIEF COMPLAINT:  Chief Complaint  Patient presents with  . Seizures    rm 6  . Follow-up    HISTORY OF PRESENT ILLNESS:   UPDATE 12/23/14: Since last visit no sz. No new events. Tolerating LEV. Has cut down beer to 1 per day.  UPDATE 12/01/13 (LL): since last visit, no further seizures. Tolerating LEV 500 mg bid well. No complaints.  UPDATE 10/30/12 (VRP): since last visit, patient is doing well. No further seizures. Last seizure January 2013. Also since last visit, I have now started to take care of his twin brother for seizure disorder, previously treated by one of my former colleagues (Dr. Sandria Manly).   UPDATE 05/03/12 (VRP): Denies any seizure or passing out episodes. Tolerating LEV 500mg  BID well. He continues to drink 6-8 beers per day. Has drank less in the last 2-3 days secondary to having a "cold".   UPDATE 01/02/12 (VRP): Back at work since August 6th. After taking am medication has light headedness, he has been taking on empty stomach. Has been feeling lightheaded after awakening for about 1-2 hours then resolves. Tolerating LEV 500mg  BID.  Denies any episodes of seizures or passing out.   UPDATE 10/02/11 (VRP): Doing well. Tolerating Lev 500mg  BID without drowsiness or nausea. Reports taking his Felicity Coyer later in the day and felt light headed but did not pass out. Reports having tingling to left chest and hand intermittently, daily, this was occuring prior to his medications. He missed his appointment with the cardiologist and is rescheduled for May 31st. Currently out of work, he drives a Scientist, clinical (histocompatibility and immunogenetics). His short term/long term disability forms have been completed by [facility]. He continues to drink 4-8 alcoholic beverages per day. Last reported episode with passing out was 06/19/11.   PRIOR HPI (Dr. Marjory Lies):  34 year old right-handed Caucasian male with no significant past medical history here for new onset dizziness and passing out episode. In January 2013 while at work he had an episode where he was unable to comprehend information someone was telling him and he passed out, he hit the back of his head that required 7-8 staples. He was unable to remember how long he was not responsive approximately one to 2 minutes. He reports his coworkers said he was trying to fight them. He did not know his birthday upon awakening. He denies any tongue biting, incontinence of bowel or bladder or convulsive activity. Reports his hands were aching for about 2 days afterwards. His mom reports he seen a while the emergency department. It took him to do 3 days before he felt better.One-week prior to this episode he had an upper respiratory infection. Denies lack of sleep, poor diet Denies any muscle aches or feeling tired. He also describes a second episode about one month later of feeling lightheaded and difficulty comprehending but did not pass out. He was able to avoid passing out. He has a history of alcohol and drug abuse, continues to drink approximately 4-8 beers per day. He denies interruptions of his drinking pattern. He reports he has frequent staring off spells but denies is able to respond with someone speaks to him. He has had multiple deja vu episodes. Denies shortness of breath, chest pain but has fast heart beat when he becomes anxious. Sleeps approximately 7 hours per night. His mother and his fraternal twin brother both  have a history of epilepsy (grand mal seizures) at the age of 32 years old.    REVIEW OF SYSTEMS: Full 14 system review of systems performed and notable only for only as per HPI.   ALLERGIES: No Known Allergies  HOME MEDICATIONS: Outpatient Prescriptions Prior to Visit  Medication Sig Dispense Refill  . levETIRAcetam (KEPPRA) 500 MG tablet Take 1 tablet (500 mg total) by mouth 2 (two) times  daily. 60 tablet 0   No facility-administered medications prior to visit.    PAST MEDICAL HISTORY: Past Medical History  Diagnosis Date  . Hypertension   . Seizures     12/23/2014 no sz activity in past year    PAST SURGICAL HISTORY: Past Surgical History  Procedure Laterality Date  . Rotator cuff repair    . Shoulder surgery      both    FAMILY HISTORY: Family History  Problem Relation Age of Onset  . Epilepsy Mother   . Epilepsy Brother   . Alzheimer's disease      Grandfather  . Transient ischemic attack      Grandmother    SOCIAL HISTORY:  History   Social History  . Marital Status: Single    Spouse Name: N/A  . Number of Children: 2  . Years of Education: HS   Occupational History  . Warehouse [employer]   Social History Main Topics  . Smoking status: Current Every Day Smoker -- 0.50 packs/day for 14 years  . Smokeless tobacco: Never Used  . Alcohol Use: 16.8 oz/week    28 Cans of beer per week     Comment: 4-8 alcohol beverages daily, 12/23/14 1 a day  . Drug Use: No  . Sexual Activity: Yes   Other Topics Concern  . Not on file   Social History Narrative   Pt lives at home with his girlfriend.   Caffeine Use: very little     PHYSICAL EXAM  GENERAL EXAM/CONSTITUTIONAL: Vitals:  Filed Vitals:   12/23/14 1001  BP: 137/89  Pulse: 70  Height: 5\' 11"  (1.803 m)  Weight: 174 lb (78.926 kg)     Body mass index is 24.28 kg/(m^2).  No exam data present  Patient is in no distress; well developed, nourished and groomed; neck is supple  CARDIOVASCULAR:  Examination of carotid arteries is normal; no carotid bruits  Regular rate and rhythm, no murmurs  Examination of peripheral vascular system by observation and palpation is normal  EYES:  Ophthalmoscopic exam of optic discs and posterior segments is normal; no papilledema or hemorrhages  MUSCULOSKELETAL:  Gait, strength, tone, movements noted in Neurologic exam  below  NEUROLOGIC: MENTAL STATUS:  No flowsheet data found.  awake, alert, oriented to person, place and time  recent and remote memory intact  normal attention and concentration  language fluent, comprehension intact, naming intact,   fund of knowledge appropriate  CRANIAL NERVE:   2nd - no papilledema on fundoscopic exam  2nd, 3rd, 4th, 6th - pupils equal and reactive to light, visual fields full to confrontation, extraocular muscles intact, no nystagmus  5th - facial sensation symmetric  7th - facial strength symmetric  8th - hearing intact  9th - palate elevates symmetrically, uvula midline  11th - shoulder shrug symmetric  12th - tongue protrusion midline  MOTOR:   normal bulk and tone, full strength in the BUE, BLE; MILD POSTURAL AND ACTION TREMOR IN HANDS (L > R)  SENSORY:   normal and symmetric to light touch, pinprick,  temperature, vibration and proprioception  COORDINATION:   finger-nose-finger, fine finger movements normal  REFLEXES:   deep tendon reflexes present and symmetric  GAIT/STATION:   narrow based gait; romberg is negative    DIAGNOSTIC DATA (LABS, IMAGING, TESTING) - I reviewed patient records, labs, notes, testing and imaging myself where available.  Lab Results  Component Value Date   WBC 4.9 07/14/2013   HGB 14.7 07/14/2013   HCT 44.8 07/14/2013   MCV 87.6 07/14/2013   PLT 205 06/10/2011      Component Value Date/Time   NA 141 07/14/2013 1126   NA 139 06/10/2011 1016   K 4.1 07/14/2013 1126   CL 101 07/14/2013 1126   CO2 22 07/14/2013 1126   GLUCOSE 79 07/14/2013 1126   GLUCOSE 99 06/10/2011 1016   BUN 17 07/14/2013 1126   BUN 15 06/10/2011 1016   CREATININE 0.81 07/14/2013 1126   CALCIUM 9.4 07/14/2013 1126   PROT 6.4 07/14/2013 1126   AST 19 07/14/2013 1126   ALT 17 07/14/2013 1126   ALKPHOS 76 07/14/2013 1126   BILITOT 0.6 07/14/2013 1126   GFRNONAA 119 07/14/2013 1126   GFRAA 138 07/14/2013 1126   Lab  Results  Component Value Date   CHOL 174 07/14/2013   HDL 87 07/14/2013   LDLCALC 79 07/14/2013   TRIG 38 07/14/2013   CHOLHDL 2.0 07/14/2013   No results found for: HGBA1C No results found for: VITAMINB12 Lab Results  Component Value Date   TSH 1.410 07/14/2013    08/25/11 EEG - intermittent, generalized bifronatlly predominant spike in wave discharges. Sometimes discharges are independent left or right frontally predominant and sometimes synchronized bifrontally. No electrographic seizures are recorded.   09/06/11 MRI brain - normal     ASSESSMENT AND PLAN  34 y.o. year old male here with generalized seizures, stable and last seizure on 06/19/11. Tolerating LEV  BID.  Now noted to have mild elevated BP, which has been there on last 3 readings.   Dx: Generalized idiopathic epilepsy, not intractable, without status epilepticus  Elevated blood pressure    PLAN: - continue Levitiracetam  BID - seizure precautions reviewed - monitor BP (slightly elevated on last 3 readings); encouraged better diet, physical activity, smoking cessation and PCP follow up  Meds ordered this encounter  Medications  . levETIRAcetam (KEPPRA) 500 MG tablet    Sig: Take 1 tablet (500 mg total) by mouth 2 (two) times daily.    Dispense:  180 tablet    Refill:  4   Return in about 1 year (around 12/23/2015).    Suanne Marker, MD 12/23/2014, 10:44 AM Certified in Neurology, Neurophysiology and Neuroimaging  [facility] [address], [address] [place], [place] [address] [phone]

## 2015-07-11 IMAGING — CR DG KNEE 1-2V*R*
2 series · 2 of 2 positions shown · non-contrast
Comparison: None.

CLINICAL DATA: Right knee pain.

EXAM:
RIGHT KNEE - 1-2 VIEW

[view not recorded (1 of 2)]
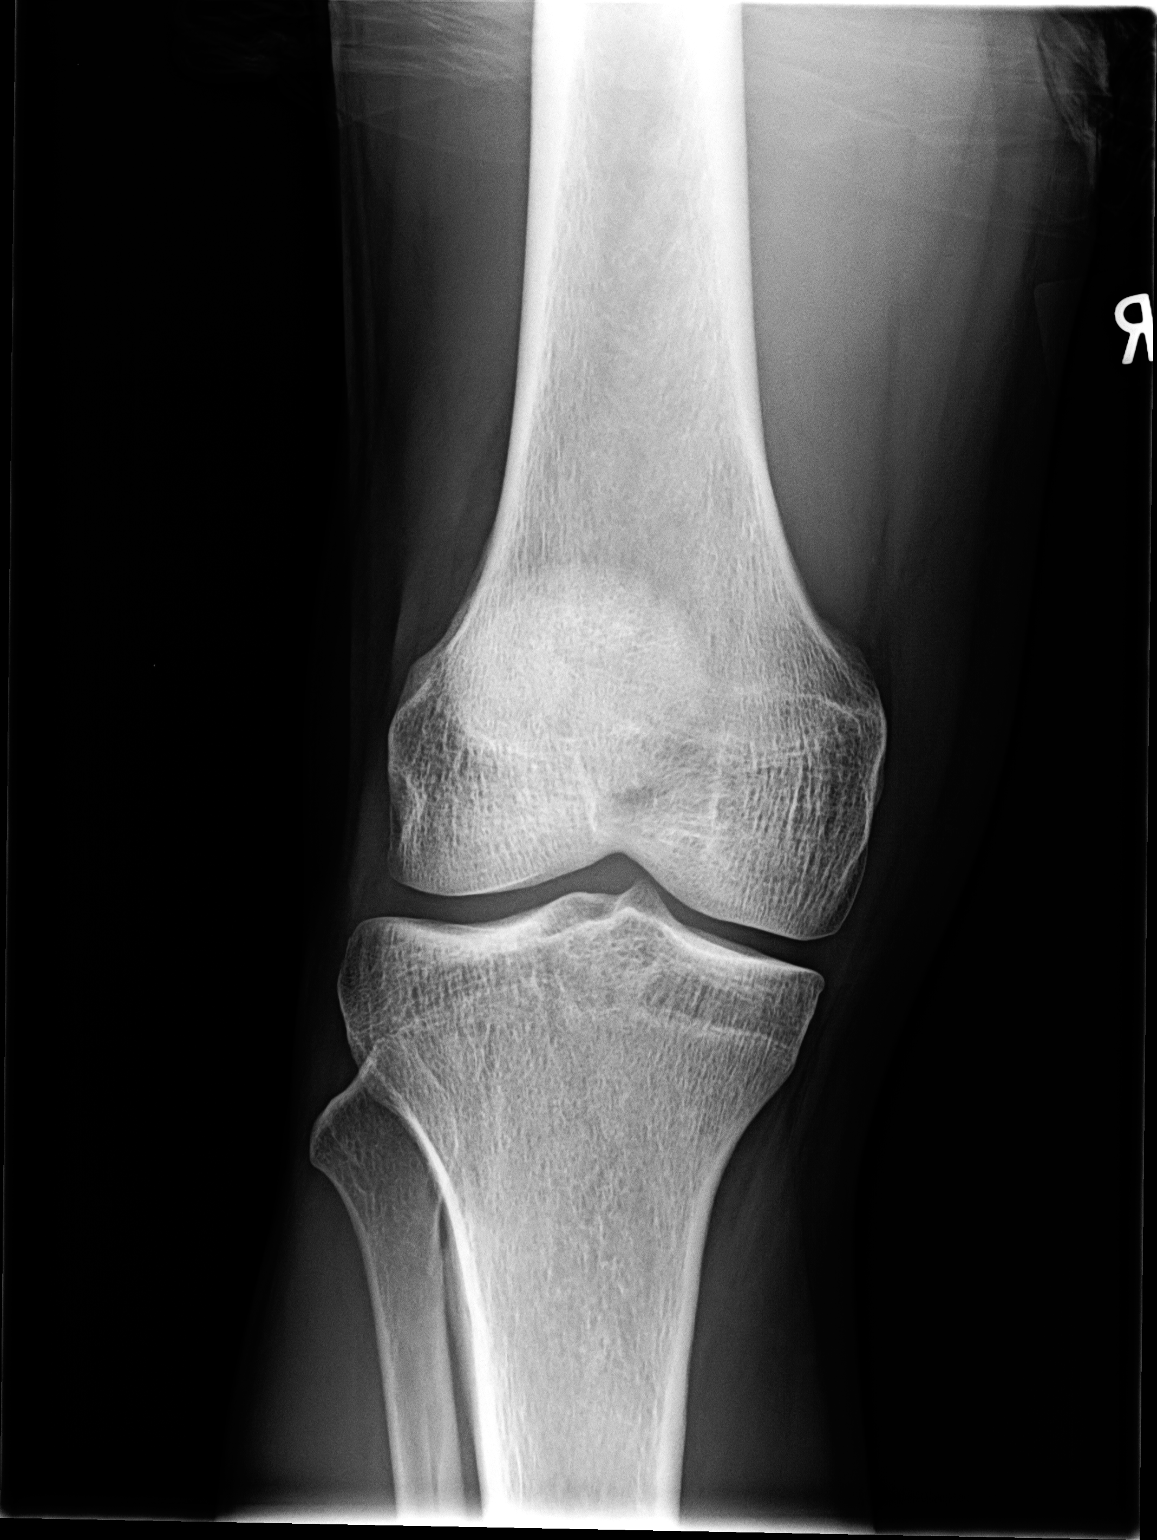

[view not recorded (2 of 2)]
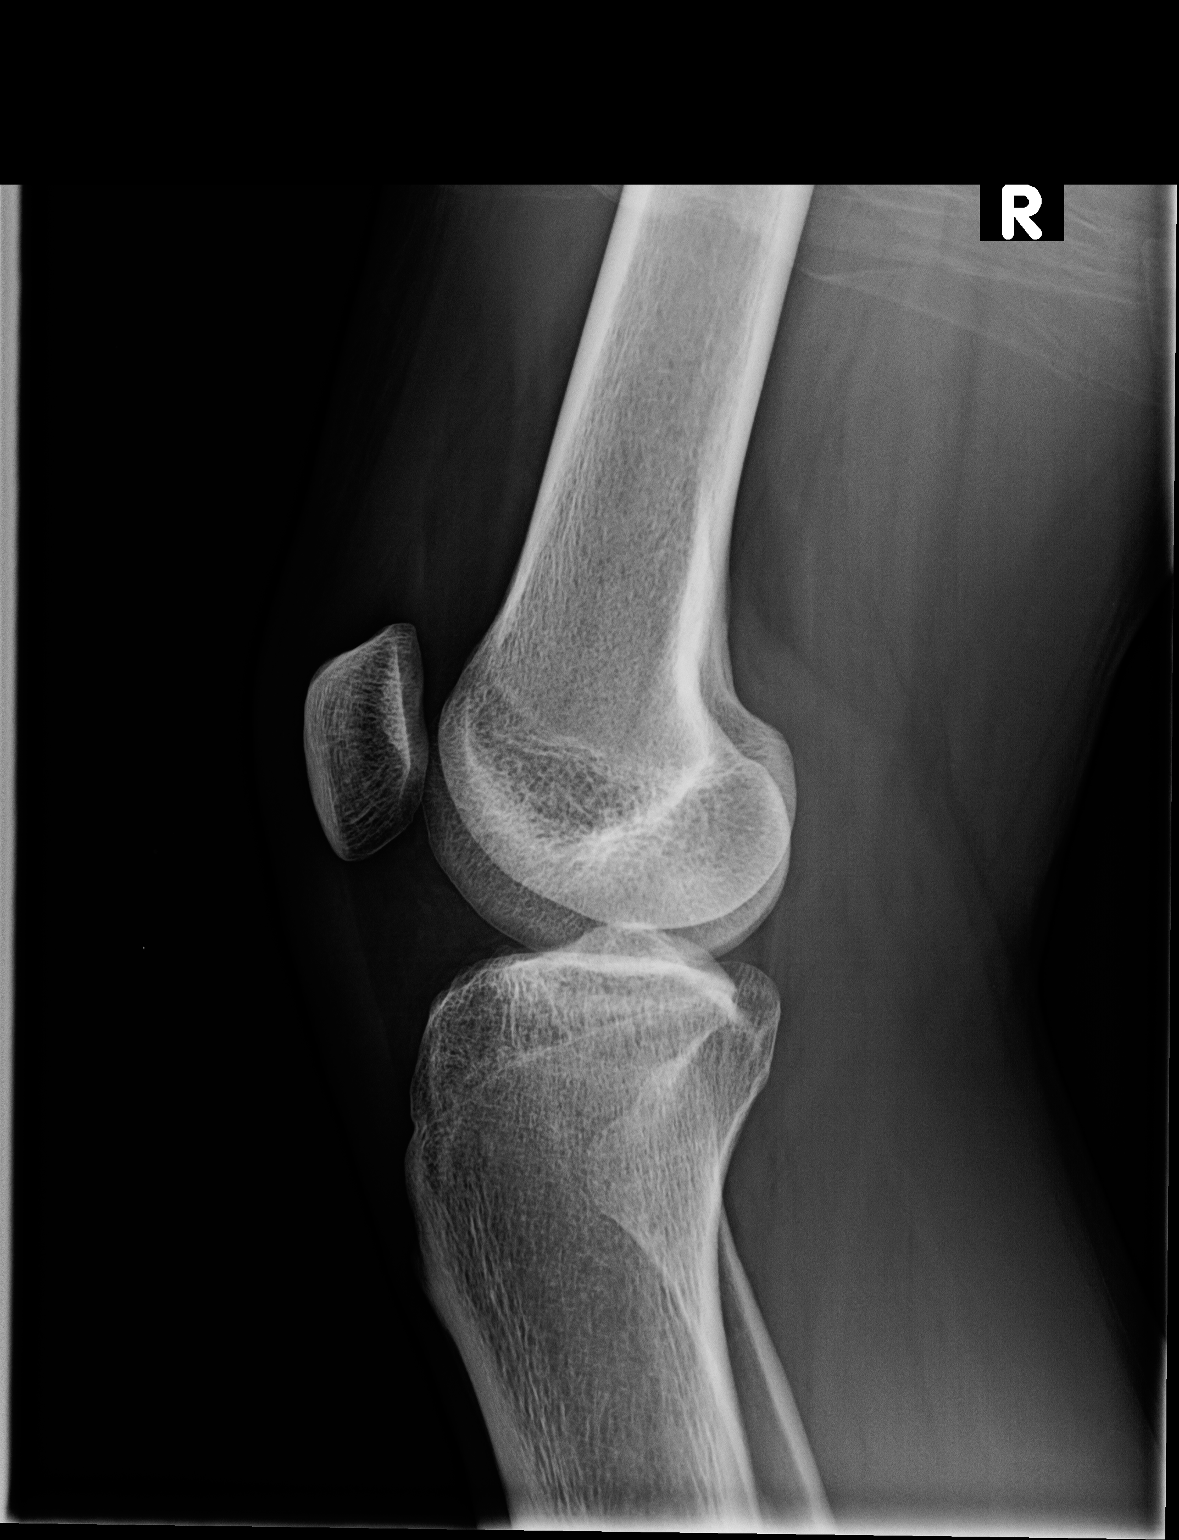

[2 of 2 positions shown; findings below may reference images not displayed]

FINDINGS: There is no evidence of fracture, dislocation, or joint effusion.
There is no evidence of arthropathy or other focal bone abnormality.
Soft tissues are unremarkable.
IMPRESSION: Normal right knee.

## 2015-12-12 IMAGING — CR DG CHEST 1V
1 series · 1 of 1 positions shown · non-contrast
Comparison: 06/10/2011

CLINICAL DATA: Pre-employment physical exam, smoker, history
hypertension, seizures

EXAM:
CHEST - 1 VIEW

[view not recorded]
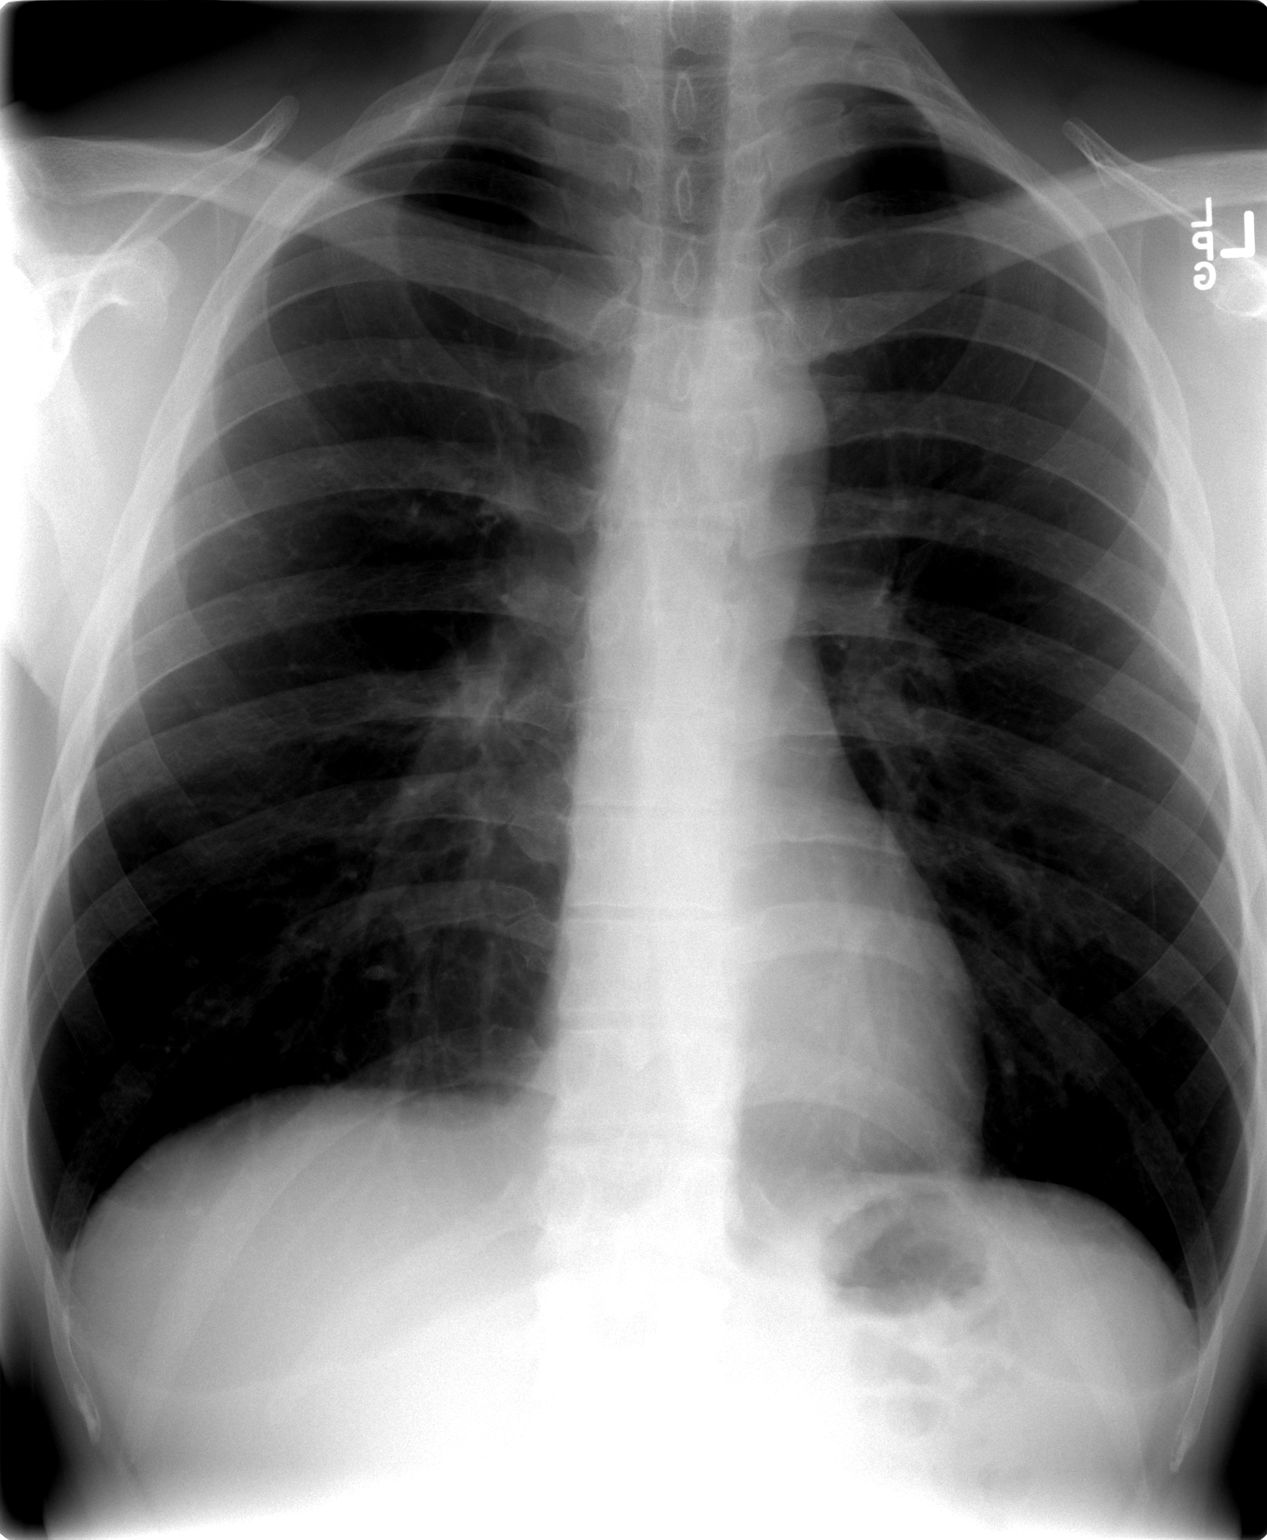

[1 of 1 positions shown; findings below may reference images not displayed]

FINDINGS: Normal heart size, mediastinal contours, and pulmonary vascularity.

BILATERAL cervical ribs.

Slightly hyperinflated lungs.

No pulmonary infiltrate, pleural effusion or pneumothorax.

No acute osseous findings.
IMPRESSION: Hyperinflated lungs.

No acute abnormalities.

## 2015-12-27 ENCOUNTER — Encounter: Payer: Self-pay | Admitting: Diagnostic Neuroimaging

## 2015-12-27 ENCOUNTER — Ambulatory Visit (INDEPENDENT_AMBULATORY_CARE_PROVIDER_SITE_OTHER): Payer: BLUE CROSS/BLUE SHIELD | Admitting: Diagnostic Neuroimaging

## 2015-12-27 VITALS — BP 141/90 | HR 110 | Wt 172.8 lb

## 2015-12-27 DIAGNOSIS — IMO0001 Reserved for inherently not codable concepts without codable children: Secondary | ICD-10-CM

## 2015-12-27 DIAGNOSIS — R030 Elevated blood-pressure reading, without diagnosis of hypertension: Secondary | ICD-10-CM

## 2015-12-27 DIAGNOSIS — G40309 Generalized idiopathic epilepsy and epileptic syndromes, not intractable, without status epilepticus: Secondary | ICD-10-CM | POA: Diagnosis not present

## 2015-12-27 MED ORDER — LEVETIRACETAM 500 MG PO TABS: 500.0000 mg | ORAL_TABLET | Freq: Two times a day (BID) | ORAL | 4 refills | Status: DC

## 2015-12-27 NOTE — Patient Instructions (Addendum)
Thank you for coming to see [place] at [facility]. I hope we have been able to provide you high quality care today.  You may receive a patient satisfaction survey over the next few weeks. We would appreciate your feedback and comments so that we may continue to improve ourselves and the health of our patients.  - continue levetiracetam 572m twice a day - monitor high blood pressure (possible due to alcohol intake) --> try to improve diet, physical activity; reduce beer to no more than 1-2 per day; buy BP machine for home use; follow up with family doctor   ~~~~~~~~~~~~~~~~~~~~~~~~~~~~~~~~~~~~~~~~~~~~~~~~~~~~~~~~~~~~~~~~~  DR. PENUMALLI'S GUIDE TO HAPPY AND HEALTHY LIVING These are some of my general health and wellness recommendations. Some of them may apply to you better than others. Please use common sense as you try these suggestions and feel free to ask me any questions.   ACTIVITY/FITNESS Mental, social, emotional and physical stimulation are very important for brain and body health. Try learning a new activity (arts, music, language, sports, games).  Keep moving your body to the best of your abilities. You can do this at home, inside or outside, the park, community center, gym or anywhere you like. Consider a physical therapist or personal trainer to get started. Consider the app Sworkit. Fitness trackers such as smart-watches, smart-phones or Fitbits can help as well.   NUTRITION Eat more plants: colorful vegetables, nuts, seeds and berries.  Eat less sugar, salt, preservatives and processed foods.  Avoid toxins such as cigarettes and alcohol.  Drink water when you are thirsty. Warm water with a slice of lemon is an excellent morning drink to start the day.  Consider these websites for more information The Nutrition Source (hhttps://www.henry-hernandez.biz/ [employer] Nutrition (wWindowBlog.ch   RELAXATION Consider  practicing mindfulness meditation or other relaxation techniques such as deep breathing, prayer, yoga, tai chi, massage. See website mindful.org or the apps Headspace or Calm to help get started.   SLEEP Try to get at least 7-8+ hours sleep per day. Regular exercise and reduced caffeine will help you sleep better. Practice good sleep hygeine techniques. See website sleep.org for more information.   PLANNING Prepare estate planning, living will, healthcare POA documents. Sometimes this is best planned with the help of an attorney. Theconversationproject.org and agingwithdignity.org are excellent resources.

## 2015-12-27 NOTE — Progress Notes (Signed)
[facility]  PATIENT: Ray Torres DOB: January 31, 1981  REFERRING CLINICIAN:  HISTORY FROM: patient  REASON FOR VISIT: follow up   HISTORICAL  CHIEF COMPLAINT:  Chief Complaint  Patient presents with  . Seizures    rm 6, "no seizures"  . Follow-up    one year    HISTORY OF PRESENT ILLNESS:   UPDATE 12/27/15: Since last visit no sz. No new events. Tolerating LEV. BP still elevated. Drinking 2 beers per day after work; sometimes up to 6+ beers per day on the weekends. Has quit smoking.   UPDATE 12/23/14: Since last visit no sz. No new events. Tolerating LEV. Has cut down beer to 1 per day.  UPDATE 12/01/13 (LL): Since last visit, no further seizures. Tolerating LEV 500 mg bid well. No complaints.  UPDATE 10/30/12 (VRP): since last visit, patient is doing well. No further seizures. Last seizure January 2013. Also since last visit, I have now started to take care of his twin brother for seizure disorder, previously treated by one of my former colleagues (Dr. Sandria ManlyLove).   UPDATE 05/03/12 (VRP): Denies any seizure or passing out episodes. Tolerating LEV 500mg  BID well. He continues to drink 6-8 beers per day. Has drank less in the last 2-3 days secondary to having a "cold".   UPDATE 01/02/12 (VRP): Back at work since August 6th. After taking am medication has light headedness, he has been taking on empty stomach. Has been feeling lightheaded after awakening for about 1-2 hours then resolves. Tolerating LEV 500mg  BID.  Denies any episodes of seizures or passing out.   UPDATE 10/02/11 (VRP): Doing well. Tolerating Lev 500mg  BID without drowsiness or nausea. Reports taking his Felicity CoyerLev later in the day and felt light headed but did not pass out. Reports having tingling to left chest and hand intermittently, daily, this was occuring prior to his medications. He missed his appointment with the cardiologist and is rescheduled for May 31st. 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 His short term/long term disability forms [facility]. He continues to drink 4-8 alcoholic beverages per day. Last reported episode with passing out was 06/19/11.   PRIOR HPI (Dr. Marjory LiesPenumalli): 35 year old right-handed male with no significant past medical history here for new onset dizziness and passing out episode. In January 2013 while at work he had an episode where he was unable to comprehend information someone was telling him and he passed out, he hit the back of his head that required 7-8 staples. He was unable to remember how long he was not responsive approximately one to 2 minutes. He reports his coworkers said he was trying to fight them. He did not know his birthday upon awakening. He denies any tongue biting, incontinence of bowel or bladder or convulsive activity. Reports his hands were aching for about 2 days afterwards. His mom reports he seen a while the emergency department. It took him to do 3 days before he felt better.One-week prior to this episode he had an upper respiratory infection. Denies lack of sleep, poor diet Denies any muscle aches or feeling tired. He also describes a second episode about one month later of feeling lightheaded and difficulty comprehending but did not pass out. He was able to avoid passing out. He has a history of alcohol and drug abuse, continues to drink approximately 4-8 beers per day. He denies interruptions of his drinking pattern. He reports he has frequent staring off spells but denies is able  to respond with someone speaks to him. He has had multiple deja vu episodes. Denies shortness of breath, chest pain but has fast heart beat when he becomes anxious. Sleeps approximately 7 hours per night. His mother and his fraternal twin brother both have a history of epilepsy (grand mal seizures) at the age of 33 years old.    REVIEW OF SYSTEMS: Full 14 system review of systems performed and notable only for only as per HPI.    ALLERGIES: No Known Allergies  HOME MEDICATIONS: Outpatient Medications Prior to Visit  Medication Sig Dispense Refill  . levETIRAcetam (KEPPRA) 500 MG tablet Take 1 tablet (500 mg total) by mouth 2 (two) times daily. 180 tablet 4   No facility-administered medications prior to visit.     PAST MEDICAL HISTORY: Past Medical History:  Diagnosis Date  . Hypertension   . Seizures (HCC)    12/23/2014 no sz activity in past year    PAST SURGICAL HISTORY: Past Surgical History:  Procedure Laterality Date  . ROTATOR CUFF REPAIR    . SHOULDER SURGERY     both    FAMILY HISTORY: Family History  Problem Relation Age of Onset  . Epilepsy Mother   . Epilepsy Brother   . Alzheimer's disease      Grandfather  . Transient ischemic attack      Grandmother    SOCIAL HISTORY:  Social History   Social History  . Marital status: Single    Spouse name: N/A  . Number of children: 2  . 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 Smoking status: Former Smoker    Packs/day: 0.50    Years: 14.00    Quit date: 09/26/2015  . Smokeless tobacco: Current User    Types: Snuff     Comment: 12/27/15 dip  occasionally  . Alcohol use 4.2 oz/week    7 Cans of beer per week     Comment: 1 beer per day  . Drug use: No  . Sexual activity: Yes   Other Topics Concern  . Not on file   Social History Narrative   Pt lives at home with his girlfriend.   Caffeine Use: very little     PHYSICAL EXAM  GENERAL EXAM/CONSTITUTIONAL: Vitals:  Vitals:   12/27/15 0955  BP: (!) 141/90  Pulse: (!) 110  Weight: 172 lb 12.8 oz (78.4 kg)   Body mass index is 24.1 kg/m. No exam data present  Patient is in no distress; well developed, nourished and groomed; neck is supple  CARDIOVASCULAR:  Examination of carotid arteries is normal; no carotid bruits  Regular rate and rhythm, no murmurs  Examination of peripheral vascular  system by observation and palpation is normal  EYES:  Ophthalmoscopic exam of optic discs and posterior segments is normal; no papilledema or hemorrhages  MUSCULOSKELETAL:  Gait, strength, tone, movements noted in Neurologic exam below  NEUROLOGIC: MENTAL STATUS:  No flowsheet data found.  awake, alert, oriented to person, place and time  recent and remote memory intact  normal attention and concentration  language fluent, comprehension intact, naming intact,   fund of knowledge appropriate  CRANIAL NERVE:   2nd - no papilledema on fundoscopic exam  2nd, 3rd, 4th, 6th - pupils equal and reactive to light, visual fields full to confrontation, extraocular muscles intact, no nystagmus  5th - facial sensation symmetric  7th - facial strength symmetric  8th - hearing  intact  9th - palate elevates symmetrically, uvula midline  11th - shoulder shrug symmetric  12th - tongue protrusion midline  MOTOR:   normal bulk and tone, full strength in the BUE, BLE; MILD POSTURAL AND ACTION TREMOR IN HANDS (L > R)  SENSORY:   normal and symmetric to light touch, pinprick, temperature, vibration and proprioception  COORDINATION:   finger-nose-finger, fine finger movements normal  REFLEXES:   deep tendon reflexes present and symmetric  GAIT/STATION:   narrow based gait; romberg is negative    DIAGNOSTIC DATA (LABS, IMAGING, TESTING) - I reviewed patient records, labs, notes, testing and imaging myself where available.  Lab Results  Component Value Date   WBC 4.9 07/14/2013   HGB 14.7 07/14/2013   HCT 44.8 07/14/2013   MCV 87.6 07/14/2013   PLT 205 06/10/2011      Component Value Date/Time   NA 141 07/14/2013 1126   K 4.1 07/14/2013 1126   CL 101 07/14/2013 1126   CO2 22 07/14/2013 1126   GLUCOSE 79 07/14/2013 1126   GLUCOSE 99 06/10/2011 1016   BUN 17 07/14/2013 1126   CREATININE 0.81 07/14/2013 1126   CALCIUM 9.4 07/14/2013 1126   PROT 6.4 07/14/2013  1126   ALBUMIN 4.7 07/14/2013 1126   AST 19 07/14/2013 1126   ALT 17 07/14/2013 1126   ALKPHOS 76 07/14/2013 1126   BILITOT 0.6 07/14/2013 1126   GFRNONAA 119 07/14/2013 1126   GFRAA 138 07/14/2013 1126   Lab Results  Component Value Date   CHOL 174 07/14/2013   HDL 87 07/14/2013   LDLCALC 79 07/14/2013   TRIG 38 07/14/2013   CHOLHDL 2.0 07/14/2013   No results found for: HGBA1C No results found for: VITAMINB12 Lab Results  Component Value Date   TSH 1.410 07/14/2013    08/25/11 EEG - intermittent, generalized bifronatlly predominant spike in wave discharges. Sometimes discharges are independent left or right frontally predominant and sometimes synchronized bifrontally. No electrographic seizures are recorded.   09/06/11 MRI brain - normal     ASSESSMENT AND PLAN  35 y.o. year old male here with generalized seizures, stable and last seizure on 06/19/11. Tolerating LEV  BID.  Continues to have elevated BP, since 2014, likely related to chronic ETOH use.   Dx:  Generalized idiopathic epilepsy, not intractable, without status epilepticus (HCC)  Elevated blood pressure    PLAN: - continue levitiracetam  BID - seizure precautions reviewed - hypertension (possible due to chronic alcohol intake) --> encouraged better diet, physical activity, reduced EtOH and PCP follow up  Meds ordered this encounter  Medications  . levETIRAcetam (KEPPRA) 500 MG tablet    Sig: Take 1 tablet (500 mg total) by mouth 2 (two) times daily.    Dispense:  180 tablet    Refill:  4   Return in about 1 year (around 12/26/2016).    Suanne Marker, MD 12/27/2015, 10:18 AM Certified in Neurology, Neurophysiology and Neuroimaging  [facility] [address] [place], [place] [address] [phone]

## 2016-10-04 ENCOUNTER — Ambulatory Visit (INDEPENDENT_AMBULATORY_CARE_PROVIDER_SITE_OTHER): Payer: BLUE CROSS/BLUE SHIELD | Admitting: Nurse Practitioner

## 2016-10-04 ENCOUNTER — Encounter: Payer: Self-pay | Admitting: Nurse Practitioner

## 2016-10-04 VITALS — BP 134/86 | HR 68 | Temp 96.7°F | Ht 71.0 in | Wt 158.0 lb

## 2016-10-04 DIAGNOSIS — S80921A Unspecified superficial injury of right lower leg, initial encounter: Secondary | ICD-10-CM | POA: Diagnosis not present

## 2016-10-04 DIAGNOSIS — W57XXXA Bitten or stung by nonvenomous insect and other nonvenomous arthropods, initial encounter: Secondary | ICD-10-CM | POA: Diagnosis not present

## 2016-10-04 MED ORDER — DOXYCYCLINE HYCLATE 50 MG PO CAPS
50.0000 mg | ORAL_CAPSULE | Freq: Two times a day (BID) | ORAL | 0 refills | Status: DC
Start: 2016-10-04 — End: 2017-01-04

## 2016-10-04 NOTE — Progress Notes (Signed)
   Subjective:    Patient ID: Ray Torres, male    DOB: [birth year], 36 y.o.   MRN: [id number]  HPI Patient comes in c/o tick bite-was doing yard work on Saturday was doing yard work and noticed a tick on right lower leg.    Review of Systems  Constitutional: Negative.   HENT: Negative.   Respiratory: Negative.   Cardiovascular: Negative.   Neurological: Negative.   Psychiatric/Behavioral: Negative.   All other systems reviewed and are negative.      Objective:   Physical Exam  Constitutional: He is oriented to person, place, and time. He appears well-developed and well-nourished. No distress.  Cardiovascular: Normal rate and regular rhythm.   Pulmonary/Chest: Effort normal and breath sounds normal.  Neurological: He is alert and oriented to person, place, and time.  Skin: Skin is warm.  Psychiatric: He has a normal mood and affect. His behavior is normal. Judgment and thought content normal.   2cm erythematous lesion left shin       Assessment & Plan:   1. Tick bite, initial encounter    Meds ordered this encounter  Medications  . doxycycline (VIBRAMYCIN) 50 MG capsule    Sig: Take 1 capsule (50 mg total) by mouth 2 (two) times daily.    Dispense:  28 capsule    Refill:  0    Order Specific Question:   Supervising Provider    Answer:   Rex KrasVINCENT, CAROL L [[id number]]   Do not pick or scratch at area rto prn  Ray Daphine DeutscherMartin, FNP

## 2016-10-04 NOTE — Patient Instructions (Signed)
Tick Bite Information Introduction Ticks are insects that attach themselves to the skin. There are many types of ticks. Common types include wood ticks and deer ticks. Sometimes, ticks carry diseases that can make a person very ill. The most common places for ticks to attach themselves are the scalp, neck, armpits, waist, and groin. HOW CAN YOU PREVENT TICK BITES? Take these steps to help prevent tick bites when you are outdoors:  Wear long sleeves and long pants.  Wear white clothes so you can see ticks more easily.  Tuck your pant legs into your socks.  If walking on a trail, stay in the middle of the trail to avoid brushing against bushes.  Avoid walking through areas with long grass.  Put bug spray on all skin that is showing and along boot tops, pant legs, and sleeve cuffs.  Check clothes, hair, and skin often and before going inside.  Brush off any ticks that are not attached.  Take a shower or bath as soon as possible after being outdoors. HOW SHOULD YOU REMOVE A TICK? Ticks should be removed as soon as possible to help prevent diseases. 1. If latex gloves are available, put them on before trying to remove a tick. 2. Use tweezers to grasp the tick as close to the skin as possible. You may also use curved forceps or a tick removal tool. Grasp the tick as close to its head as possible. Avoid grasping the tick on its body. 3. Pull gently upward until the tick lets go. Do not twist the tick or jerk it suddenly. This may break off the tick's head or mouth parts. 4. Do not squeeze or crush the tick's body. This could force disease-carrying fluids from the tick into your body. 5. After the tick is removed, wash the bite area and your hands with soap and water or alcohol. 6. Apply a small amount of antiseptic cream or ointment to the bite site. 7. Wash any tools that were used. Do not try to remove a tick by applying a hot match, petroleum jelly, or fingernail polish to the tick. These  methods do not work. They may also increase the chances of disease being spread from the tick bite. WHEN SHOULD YOU SEEK HELP? Contact your health care provider if you are unable to remove a tick or if a part of the tick breaks off in the skin. After a tick bite, you need to watch for signs and symptoms of diseases that can be spread by ticks. Contact your health care provider if you develop any of the following:  Fever.  Rash.  Redness and puffiness (swelling) in the area of the tick bite.  Tender, puffy lymph glands.  Watery poop (diarrhea).  Weight loss.  Cough.  Feeling more tired than normal (fatigue).  Muscle, joint, or bone pain.  Belly (abdominal) pain.  Headache.  Change in your level of consciousness.  Trouble walking or moving your legs.  Loss of feeling (numbness) in the legs.  Loss of movement (paralysis).  Shortness of breath.  Confusion.  Throwing up (vomiting) many times. This information is not intended to replace advice given to you by your health care provider. Make sure you discuss any questions you have with your health care provider. Document Released: 08/02/2009 Document Revised: 10/14/2015 Document Reviewed: 10/16/2012 Elsevier Interactive Patient Education  2017 Elsevier Inc.  

## 2016-12-27 ENCOUNTER — Ambulatory Visit: Payer: BLUE CROSS/BLUE SHIELD | Admitting: Diagnostic Neuroimaging

## 2017-01-03 ENCOUNTER — Ambulatory Visit: Payer: BLUE CROSS/BLUE SHIELD | Admitting: Adult Health

## 2017-01-04 ENCOUNTER — Encounter (INDEPENDENT_AMBULATORY_CARE_PROVIDER_SITE_OTHER): Payer: Self-pay

## 2017-01-04 ENCOUNTER — Encounter: Payer: Self-pay | Admitting: Adult Health

## 2017-01-04 ENCOUNTER — Ambulatory Visit (INDEPENDENT_AMBULATORY_CARE_PROVIDER_SITE_OTHER): Payer: BLUE CROSS/BLUE SHIELD | Admitting: Adult Health

## 2017-01-04 VITALS — BP 139/87 | HR 79 | Ht 71.0 in | Wt 164.0 lb

## 2017-01-04 DIAGNOSIS — R569 Unspecified convulsions: Secondary | ICD-10-CM | POA: Diagnosis not present

## 2017-01-04 MED ORDER — LEVETIRACETAM 500 MG PO TABS: 500.0000 mg | ORAL_TABLET | Freq: Two times a day (BID) | ORAL | 4 refills | Status: DC

## 2017-01-04 NOTE — Patient Instructions (Signed)
Your Plan: ° °Continue Keppra 500 mg twice a day  °If your symptoms worsen or you develop new symptoms please let us know.  ° ° °Thank you for coming to see us at [facility]. I hope we have been able to provide you high quality care today. ° °You may receive a patient satisfaction survey over the next few weeks. We would appreciate your feedback and comments so that we may continue to improve ourselves and the health of our patients. ° °

## 2017-01-04 NOTE — Progress Notes (Signed)
PATIENT: Ray CollegeMichael D Dunnam DOB: 12/28/1980  REASON FOR VISIT: follow up- seizures HISTORY FROM: patient  HISTORY OF PRESENT ILLNESS: Today 01/04/17 Ray Torres is a 36 year old male with a history of seizure events. He returns today for follow-up. He is currently on Keppra taking 500 mg twice a day. He denies any seizure events. He operates a Librarian, academicmotor vehicle. Denies any changes with his gait or balance. No change in mood or behavior. Reports that he continues to have approximately 2 beers after work and unsure how many he has on the weekend. He denies any new neurological symptoms. He does report he has a tremor in the left arm after having shoulder surgery. He reports that this is not new but rather ongoing. He returns today for an evaluation.   UPDATE 12/27/15: Since last visit no sz. No new events. Tolerating LEV. BP still elevated. Drinking 2 beers per day after work; sometimes up to 6+ beers per day on the weekends. Has quit smoking.   UPDATE 12/23/14: Since last visit no sz. No new events. Tolerating LEV. Has cut down beer to 1 per day.  UPDATE 12/01/13 (LL): Since last visit, no further seizures. Tolerating LEV 500 mg bid well. No complaints.  UPDATE 10/30/12 (VRP): since last visit, patient is doing well. No further seizures. Last seizure January 2013. Also since last visit, I have now started to take care of his twin brother for seizure disorder, previously treated by one of my former colleagues (Dr. Sandria ManlyLove).   UPDATE 05/03/12 (VRP): Denies any seizure or passing out episodes. Tolerating LEV 500mg  BID well. He continues to drink 6-8 beers per day. Has drank less in the last 2-3 days secondary to having a "cold".   UPDATE 01/02/12 (VRP): Back at work since August 6th. After taking am medication has light headedness, he has been taking on empty stomach. Has been feeling lightheaded after awakening for about 1-2 hours then resolves. Tolerating LEV 500mg  BID.  Denies any episodes of  seizures or passing out.   UPDATE 10/02/11 (VRP): Doing well. Tolerating Lev 500mg  BID without drowsiness or nausea. Reports taking his Felicity CoyerLev later in the day and felt light headed but did not pass out. Reports having tingling to left chest and hand intermittently, daily, this was occuring prior to his medications. He missed his appointment with the cardiologist and is rescheduled for May 31st. 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 His short term/long term disability forms have been completed by [facility]. He continues to drink 4-8 alcoholic beverages per day. Last reported episode with passing out was 06/19/11.   PRIOR HPI (Dr. Marjory LiesPenumalli): 36 year old right-handed male with no significant past medical history here for new onset dizziness and passing out episode. In January 2013 while at work he had an episode where he was unable to comprehend information someone was telling him and he passed out, he hit the back of his head that required 7-8 staples. He was unable to remember how long he was not responsive approximately one to 2 minutes. He reports his coworkers said he was trying to fight them. He did not know his birthday upon awakening. He denies any tongue biting, incontinence of bowel or bladder or convulsive activity. Reports his hands were aching for about 2 days afterwards. His mom reports he seen a while the emergency department. It took him to do 3 days before he felt better.One-week prior to this episode he had an upper respiratory infection. Denies lack of sleep, poor  diet Denies any muscle aches or feeling tired. He also describes a second episode about one month later of feeling lightheaded and difficulty comprehending but did not pass out. He was able to avoid passing out. He has a history of alcohol and drug abuse, continues to drink approximately 4-8 beers per day. He denies interruptions of his drinking pattern. He reports he has frequent staring off spells but denies is able to  respond with someone speaks to him. He has had multiple deja vu episodes. Denies shortness of breath, chest pain but has fast heart beat when he becomes anxious. Sleeps approximately 7 hours per night. His mother and his fraternal twin brother both have a history of epilepsy (grand mal seizures) at the age of 34 years old.    REVIEW OF SYSTEMS: Out of a complete 14 system review of symptoms, the patient complains only of the following symptoms, and all other reviewed systems are negative.  See history of present illness  ALLERGIES: No Known Allergies  HOME MEDICATIONS: Outpatient Medications Prior to Visit  Medication Sig Dispense Refill  . doxycycline (VIBRAMYCIN) 50 MG capsule Take 1 capsule (50 mg total) by mouth 2 (two) times daily. 28 capsule 0  . levETIRAcetam (KEPPRA) 500 MG tablet Take 1 tablet (500 mg total) by mouth 2 (two) times daily. 180 tablet 4   No facility-administered medications prior to visit.     PAST MEDICAL HISTORY: Past Medical History:  Diagnosis Date  . Hypertension   . Seizures (HCC)    12/23/2014 no sz activity in past year    PAST SURGICAL HISTORY: Past Surgical History:  Procedure Laterality Date  . ROTATOR CUFF REPAIR    . SHOULDER SURGERY     both    FAMILY HISTORY: Family History  Problem Relation Age of Onset  . Epilepsy Mother   . Epilepsy Brother   . Alzheimer's disease Unknown        Grandfather  . Transient ischemic attack Unknown        Grandmother    SOCIAL HISTORY: Social History   Social History  . Marital status: Single    Spouse name: N/A  . Number of children: 2  . 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 Smoking status: Former Smoker    Packs/day: 0.50    Years: 14.00    Quit date: 09/26/2015  . Smokeless tobacco: Current User    Types: Snuff     Comment: 12/27/15 dip  occasionally  . Alcohol use 4.2 oz/week    7 Cans of beer per week      Comment: 1 beer per day  . Drug use: No  . Sexual activity: Yes   Other Topics Concern  . Not on file   Social History Narrative   Pt lives at home with his girlfriend.   Caffeine Use: very little      PHYSICAL EXAM  Vitals:   01/04/17 1326  BP: 139/87  Pulse: 79  Weight: 164 lb (74.4 kg)  Height: 5\' 11"  (1.803 m)   Body mass index is 22.87 kg/m.  Generalized: Well developed, in no acute distress   Neurological examination  Mentation: Alert oriented to time, place, history taking. Follows all commands speech and language fluent Cranial nerve II-XII: Pupils were equal round reactive to light. Extraocular movements were full, visual field were full on confrontational test. Facial sensation and strength were normal. Uvula tongue midline.  Head turning and shoulder shrug  were normal and symmetric. Motor: The motor testing reveals 5 over 5 strength of all 4 extremities. Good symmetric motor tone is noted throughout.  Sensory: Sensory testing is intact to soft touch on all 4 extremities. No evidence of extinction is noted.  Coordination: Cerebellar testing reveals good finger-nose-finger and heel-to-shin bilaterally. Tremor noted in left arm Gait and station: Gait is normal. Tandem gait is normal. Romberg is negative. No drift is seen.  Reflexes: Deep tendon reflexes are symmetric and normal bilaterally.   DIAGNOSTIC DATA (LABS, IMAGING, TESTING) - I reviewed patient records, labs, notes, testing and imaging myself where available.  Lab Results  Component Value Date   WBC 4.9 07/14/2013   HGB 14.7 07/14/2013   HCT 44.8 07/14/2013   MCV 87.6 07/14/2013   PLT 205 06/10/2011      Component Value Date/Time   NA 141 07/14/2013 1126   K 4.1 07/14/2013 1126   CL 101 07/14/2013 1126   CO2 22 07/14/2013 1126   GLUCOSE 79 07/14/2013 1126   GLUCOSE 99 06/10/2011 1016   BUN 17 07/14/2013 1126   CREATININE 0.81 07/14/2013 1126   CALCIUM 9.4 07/14/2013 1126   PROT 6.4  07/14/2013 1126   ALBUMIN 4.7 07/14/2013 1126   AST 19 07/14/2013 1126   ALT 17 07/14/2013 1126   ALKPHOS 76 07/14/2013 1126   BILITOT 0.6 07/14/2013 1126   GFRNONAA 119 07/14/2013 1126   GFRAA 138 07/14/2013 1126    Lab Results  Component Value Date   TSH 1.410 07/14/2013      ASSESSMENT AND PLAN 36 y.o. year old male  has a past medical history of Hypertension and Seizures (HCC). here with:  1. Seizures  Overall the patient has remained stable. He will continue on Keppra 500 mg twice a day. He is encouraged to reduce his alcohol intake. He is advised that if he has any seizure events he should let us know. He will follow-up in one year or sooner if needed.  I spent 15 minutes with the patient. 50% of this time was spent discussing his medication and seizure precautions.      Butch Penny, MSN, NP-C 01/04/2017, 1:18 PM [facility] [address] [place], [place] [address] [phone]

## 2017-01-08 NOTE — Progress Notes (Signed)
I reviewed note and agree with plan.   Suanne Marker, MD 01/08/2017, 6:51 PM Certified in Neurology, Neurophysiology and Neuroimaging  [facility] [address], [address] [place], [place] [address] [phone]

## 2017-03-21 ENCOUNTER — Ambulatory Visit: Payer: BLUE CROSS/BLUE SHIELD | Admitting: Adult Health

## 2018-01-08 ENCOUNTER — Ambulatory Visit: Payer: BLUE CROSS/BLUE SHIELD | Admitting: Adult Health

## 2018-01-08 ENCOUNTER — Encounter: Payer: Self-pay | Admitting: Adult Health

## 2018-01-08 VITALS — BP 148/110 | HR 83 | Ht 71.0 in | Wt 184.6 lb

## 2018-01-08 DIAGNOSIS — R569 Unspecified convulsions: Secondary | ICD-10-CM

## 2018-01-08 DIAGNOSIS — F1010 Alcohol abuse, uncomplicated: Secondary | ICD-10-CM | POA: Diagnosis not present

## 2018-01-08 MED ORDER — LEVETIRACETAM 500 MG PO TABS: 500.0000 mg | ORAL_TABLET | Freq: Two times a day (BID) | ORAL | 4 refills | Status: DC

## 2018-01-08 NOTE — Patient Instructions (Signed)
Your Plan:  Continue Keppra  If your symptoms worsen or you develop new symptoms please let us know.    Thank you for coming to see us at [facility]. I hope we have been able to provide you high quality care today.  You may receive a patient satisfaction survey over the next few weeks. We would appreciate your feedback and comments so that we may continue to improve ourselves and the health of our patients.  

## 2018-01-08 NOTE — Progress Notes (Signed)
PATIENT: Ray Torres DOB: [birth year]  REASON FOR VISIT: follow up HISTORY FROM: patient  HISTORY OF PRESENT ILLNESS: Today 01/08/18: Ray Torres is a 37 year old male with a history of seizures.  He returns today for follow-up.  He remains on Keppra 500 mg twice a day.  He denies any seizure events.  He operates a Librarian, academic without difficulty.  He reports that he drinks approximately 6 beers a day and more on the weekends.  He does acknowledge that he has a problem.  Reports that when he was in his 60s he did go to rehab and was sober for 9 months.  He states that he has a family history of alcoholism.  The patient's blood pressure is slightly elevated today.  He does have a primary care provider but does not see them regularly.  He returns today for evaluation.  Upate 01/04/17 MM: Ray Torres is a 37 year old male with a history of seizure events. He returns today for follow-up. He is currently on Keppra taking 500 mg twice a day. He denies any seizure events. He operates a Librarian, academic. Denies any changes with his gait or balance. No change in mood or behavior. Reports that he continues to have approximately 2 beers after work and unsure how many he has on the weekend. He denies any new neurological symptoms. He does report he has a tremor in the left arm after having shoulder surgery. He reports that this is not new but rather ongoing. He returns today for an evaluation.   UPDATE 12/27/15: Since last visit no sz. No new events. Tolerating LEV. BP still elevated. Drinking 2 beers per day after work; sometimes up to 6+ beers per day on the weekends. Has quit smoking.   UPDATE 12/23/14: Since last visit no sz. No new events. Tolerating LEV. Has cut down beer to 1 per day.  UPDATE 12/01/13 (LL): Since last visit, no further seizures. Tolerating LEV 500 mg bid well. No complaints.  UPDATE 10/30/12 (VRP): since last visit, patient is doing well. No further seizures. Last seizure  January 2013. Also since last visit, I have now started to take care of his twin brother for seizure disorder, previously treated by one of my former colleagues (Dr. Sandria Manly).   UPDATE 05/03/12 (VRP): Denies any seizure or passing out episodes. Tolerating LEV 500mg  BID well. He continues to drink 6-8 beers per day. Has drank less in the last 2-3 days secondary to having a "cold".   UPDATE 01/02/12 (VRP): Back at work since August 6th. After taking am medication has light headedness, he has been taking on empty stomach. Has been feeling lightheaded after awakening for about 1-2 hours then resolves. Tolerating LEV 500mg  BID.  Denies any episodes of seizures or passing out.   UPDATE 10/02/11 (VRP): Doing well. Tolerating Lev 500mg  BID without drowsiness or nausea. Reports taking his Felicity Coyer later in the day and felt light headed but did not pass out. Reports having tingling to left chest and hand intermittently, daily, this was occuring prior to his medications. He missed his appointment with the cardiologist and is rescheduled for May 31st. Currently out of work, he drives a Scientist, clinical (histocompatibility and immunogenetics). His short term/long term disability forms have been completed by [facility]. He continues to drink 4-8 alcoholic beverages per day. Last reported episode with passing out was 06/19/11.   PRIOR HPI (Dr. Marjory Lies): 37 year old right-handed male with no significant past medical history here for new onset dizziness and passing out episode. In  January 2013 while at work he had an episode where he was unable to comprehend information someone was telling him and he passed out, he hit the back of his head that required 7-8 staples. He was unable to remember how long he was not responsive approximately one to 2 minutes. He reports his coworkers said he was trying to fight them. He did not know his birthday upon awakening. He denies any tongue biting, incontinence of bowel or bladder or convulsive activity. Reports his hands were aching  for about 2 days afterwards. His mom reports he seen a while the emergency department. It took him to do 3 days before he felt better.One-week prior to this episode he had an upper respiratory infection. Denies lack of sleep, poor diet Denies any muscle aches or feeling tired. He also describes a second episode about one month later of feeling lightheaded and difficulty comprehending but did not pass out. He was able to avoid passing out. He has a history of alcohol and drug abuse, continues to drink approximately 4-8 beers per day. He denies interruptions of his drinking pattern. He reports he has frequent staring off spells but denies is able to respond with someone speaks to him. He has had multiple deja vu episodes. Denies shortness of breath, chest pain but has fast heart beat when he becomes anxious. Sleeps approximately 7 hours per night. His mother and his fraternal twin brother both have a history of epilepsy (grand mal seizures) at the age of 35 years old.     REVIEW OF SYSTEMS: Out of a complete 14 system review of symptoms, the patient complains only of the following symptoms, and all other reviewed systems are negative.  See HPI  ALLERGIES: No Known Allergies  HOME MEDICATIONS: Outpatient Medications Prior to Visit  Medication Sig Dispense Refill  . levETIRAcetam (KEPPRA) 500 MG tablet Take 1 tablet (500 mg total) by mouth 2 (two) times daily. 180 tablet 4   No facility-administered medications prior to visit.     PAST MEDICAL HISTORY: Past Medical History:  Diagnosis Date  . Hypertension   . Seizures (HCC)    12/23/2014 no sz activity in past year    PAST SURGICAL HISTORY: Past Surgical History:  Procedure Laterality Date  . ROTATOR CUFF REPAIR    . SHOULDER SURGERY     both    FAMILY HISTORY: Family History  Problem Relation Age of Onset  . Epilepsy Mother   . Epilepsy Brother   . Alzheimer's disease Unknown        Grandfather  . Transient ischemic attack  Unknown        Grandmother    SOCIAL HISTORY: Social History   Socioeconomic History  . Marital status: Single    Spouse name: Not on file  . Number of children: 2  . Years of education: HS  . Highest education level: Not on file  Occupational History  . Occupation: Environmental education officer: Designer, jewellery  Social Needs  . Financial resource strain: Not on file  . Food insecurity:    Worry: Not on file    Inability: Not on file  . Transportation needs:    Medical: Not on file    Non-medical: Not on file  Tobacco Use  . Smoking status: Former Smoker    Packs/day: 0.50    Years: 14.00    Pack years: 7.00    Last attempt to quit: 09/26/2015    Years since quitting: 2.2  . Smokeless  tobacco: Current User    Types: Snuff  . Tobacco comment: 12/27/15 dip  occasionally  Substance and Sexual Activity  . Alcohol use: Yes    Alcohol/week: 7.0 standard drinks    Types: 7 Cans of beer per week    Comment: 1 beer per day  . Drug use: No  . Sexual activity: Yes  Lifestyle  . Physical activity:    Days per week: Not on file    Minutes per session: Not on file  . Stress: Not on file  Relationships  . Social connections:    Talks on phone: Not on file    Gets together: Not on file    Attends religious service: Not on file    Active member of club or organization: Not on file    Attends meetings of clubs or organizations: Not on file    Relationship status: Not on file  . Intimate partner violence:    Fear of current or ex partner: Not on file    Emotionally abused: Not on file    Physically abused: Not on file    Forced sexual activity: Not on file  Other Topics Concern  . Not on file  Social History Narrative   Pt lives at home with his girlfriend.   Caffeine Use: very little      PHYSICAL EXAM  Vitals:   01/08/18 1501 01/08/18 1511  BP: (!) 159/102 (!) 148/110  Pulse: 83   Weight: 184 lb 9.6 oz (83.7 kg)   Height: 5\' 11"  (1.803 m)    Body mass index is 25.75  kg/m.  Generalized: Well developed, in no acute distress   Neurological examination  Mentation: Alert oriented to time, place, history taking. Follows all commands speech and language fluent Cranial nerve II-XII: Pupils were equal round reactive to light. Extraocular movements were full, visual field were full on confrontational test. Facial sensation and strength were normal. Uvula tongue midline. Head turning and shoulder shrug  were normal and symmetric. Motor: The motor testing reveals 5 over 5 strength of all 4 extremities. Good symmetric motor tone is noted throughout.  Sensory: Sensory testing is intact to soft touch on all 4 extremities. No evidence of extinction is noted.  Coordination: Cerebellar testing reveals good finger-nose-finger and heel-to-shin bilaterally.  Gait and station: Gait is normal. Tandem gait is normal. Romberg is negative. No drift is seen.  Reflexes: Deep tendon reflexes are symmetric and normal bilaterally.   DIAGNOSTIC DATA (LABS, IMAGING, TESTING) - I reviewed patient records, labs, notes, testing and imaging myself where available.  Lab Results  Component Value Date   WBC 4.9 07/14/2013   HGB 14.7 07/14/2013   HCT 44.8 07/14/2013   MCV 87.6 07/14/2013   PLT 205 06/10/2011      Component Value Date/Time   NA 141 07/14/2013 1126   K 4.1 07/14/2013 1126   CL 101 07/14/2013 1126   CO2 22 07/14/2013 1126   GLUCOSE 79 07/14/2013 1126   GLUCOSE 99 06/10/2011 1016   BUN 17 07/14/2013 1126   CREATININE 0.81 07/14/2013 1126   CALCIUM 9.4 07/14/2013 1126   PROT 6.4 07/14/2013 1126   ALBUMIN 4.7 07/14/2013 1126   AST 19 07/14/2013 1126   ALT 17 07/14/2013 1126   ALKPHOS 76 07/14/2013 1126   BILITOT 0.6 07/14/2013 1126   GFRNONAA 119 07/14/2013 1126   GFRAA 138 07/14/2013 1126   Lab Results  Component Value Date   CHOL 174 07/14/2013   HDL 87 07/14/2013  LDLCALC 79 07/14/2013   TRIG 38 07/14/2013   CHOLHDL 2.0 07/14/2013   No results found  for: HGBA1C No results found for: VITAMINB12 Lab Results  Component Value Date   TSH 1.410 07/14/2013      ASSESSMENT AND PLAN 37 y.o. year old male  has a past medical history of Hypertension and Seizures (HCC). here with:  1.  Seizures 2.  Alcohol abuse  Overall the patient has done well in regards to his seizures.  He will continue on Keppra 500 mg twice a day.  I had a long discussion with the patient regarding his alcohol use.  I strongly encouraged the patient to enroll in a program such as AA to help him quit.  Patient voiced understanding.  His blood pressure is elevated today.  I have encouraged him to make an appointment with his primary care provider.  Patient voiced understanding.  He will follow-up in 1 year or sooner if needed.     Butch PennyMegan Jacarri Gesner, MSN, NP-C 01/08/2018, 2:58 PM [facility] [address], [address] [place], [place] [address] [phone]

## 2018-02-06 NOTE — Progress Notes (Signed)
I reviewed note and agree with plan.   Masaji Billups R. Ismerai Bin, MD  Certified in Neurology, Neurophysiology and Neuroimaging  [facility] [address], [address] [place],  [address] [phone]   

## 2018-12-31 ENCOUNTER — Telehealth: Payer: Self-pay | Admitting: Adult Health

## 2018-12-31 NOTE — Telephone Encounter (Signed)
I called patient regarding 8/25 appointment. I LVM advising patient that due to schedule changes we would need to convert this appointment to a virtual visit and set the patient up for Mychart, or reschedule for a different day in office. Requested patient call back to discuss.

## 2019-01-09 ENCOUNTER — Encounter: Payer: Self-pay | Admitting: Adult Health

## 2019-01-09 NOTE — Telephone Encounter (Signed)
I attempted to reach this patient again today regarding 8/25 appointment. I LVM stating that this appointment has now been cancelled. I have sent patient a cancellation letter to advise of this change.

## 2019-01-14 ENCOUNTER — Ambulatory Visit: Payer: BLUE CROSS/BLUE SHIELD | Admitting: Adult Health

## 2019-01-15 ENCOUNTER — Other Ambulatory Visit: Payer: Self-pay | Admitting: Adult Health

## 2019-01-22 ENCOUNTER — Telehealth: Payer: Self-pay | Admitting: *Deleted

## 2019-01-22 ENCOUNTER — Ambulatory Visit: Payer: Self-pay | Admitting: Family Medicine

## 2019-01-22 ENCOUNTER — Telehealth: Payer: Self-pay

## 2019-01-22 ENCOUNTER — Other Ambulatory Visit: Payer: Self-pay

## 2019-01-22 ENCOUNTER — Encounter: Payer: Self-pay | Admitting: Family Medicine

## 2019-01-22 VITALS — BP 158/84 | HR 88 | Temp 98.6°F | Ht 71.5 in | Wt 190.0 lb

## 2019-01-22 DIAGNOSIS — G40909 Epilepsy, unspecified, not intractable, without status epilepticus: Secondary | ICD-10-CM

## 2019-01-22 MED ORDER — LEVETIRACETAM ER 500 MG PO TB24: 1000.0000 mg | ORAL_TABLET | Freq: Every day | ORAL | 3 refills | Status: DC

## 2019-01-22 NOTE — Telephone Encounter (Signed)
Opened in error

## 2019-01-22 NOTE — Progress Notes (Signed)
PATIENT: Ray Torres DOB: 1980-07-08  REASON FOR VISIT: follow up HISTORY FROM: patient  Chief Complaint  Patient presents with   Follow-up    Room 3, alone. seizure f/u. "has not had a seizures since he was 28. No concerns"     HISTORY OF PRESENT ILLNESS: Today 01/22/19 Ray Torres is a 38 y.o. male here today for follow up of seizure disorder. He continues levetiracetam 500mg  twice daily.  He admits that he is having difficulty remembering the second dose.  He is pretty consistent with taking morning dose daily but is missing the evening dose several times weekly.  He denies any seizure activity.  He is tolerating medication well otherwise.  He has cut back on alcohol intake.  He does not drink every day.  HISTORY: (copied from [place] note on 01/08/2018)  Ray Torres is a 38 year old male with a history of seizures.  He returns today for follow-up.  He remains on Keppra 500 mg twice a day.  He denies any seizure events.  He operates a Teacher, music without difficulty.  He reports that he drinks approximately 6 beers a day and more on the weekends.  He does acknowledge that he has a problem.  Reports that when he was in his 54s he did go to rehab and was sober for 9 months.  He states that he has a family history of alcoholism.  The patient's blood pressure is slightly elevated today.  He does have a primary care provider but does not see them regularly.  He returns today for evaluation.  Upate 01/04/17 Ray Torres a 38 year old male with a history of seizure events. He returns today for follow-up. He is currently on Keppra taking 500 mg twice a day. He denies any seizure events. He operates a Teacher, music. Denies any changes with his gait or balance. No change in mood or behavior. Reports that he continues to have approximately 2 beers after work andunsure how many he has on the weekend. He denies any new neurological symptoms. He does report he has a  tremor in the left arm after having shoulder surgery. He reports that this is not new but rather ongoing. He returns today for an evaluation.  UPDATE 12/27/15: Since last visit no sz. No new events. Tolerating LEV. BP still elevated. Drinking 2 beers per day after work; sometimes up to 6+ beers per day on the weekends. Has quit smoking.   UPDATE 12/23/14: Since last visit no sz. No new events. Tolerating LEV. Has cut down beer to 1 per day.  UPDATE 12/01/13 (LL): Since last visit, no further seizures. Tolerating LEV 500 mg bid well. No complaints.  UPDATE 10/30/12 (VRP): since last visit, patient is doing well. No further seizures. Last seizure January 2013. Also since last visit, I have now started to take care of his twin brother for seizure disorder, previously treated by one of my former colleagues (Ray Torres).   UPDATE 05/03/12 (VRP): Denies any seizure or passing out episodes. Tolerating LEV 500mg  BID well. He continues to drink 6-8 beers per day. Has drank less in the last 2-3 days secondary to having a "cold".   UPDATE 01/02/12 (VRP): Back at work since August 6th. After taking am medication has light headedness, he has been taking on empty stomach. Has been feeling lightheaded after awakening for about 1-2 hours then resolves. Tolerating LEV 500mg  BID.  Denies any episodes of seizures or passing out.   UPDATE 10/02/11 (VRP):  Doing well. Tolerating Lev 500mg  BID without drowsiness or nausea. Reports taking his Felicity Coyer later in the day and felt light headed but did not pass out. Reports having tingling to left chest and hand intermittently, daily, this was occuring prior to his medications. He missed his appointment with the cardiologist and is rescheduled for May 31st. Currently out of work, he drives a Scientist, clinical (histocompatibility and immunogenetics). His short term/long term disability forms have been completed by [facility]. He continues to drink 4-8 alcoholic beverages per day. Last reported episode with passing out was 06/19/11.    PRIOR HPI (Ray Torres): 38 year old right-handed male with no significant past medical history here for new onset dizziness and passing out episode. In January 2013 while at work he had an episode where he was unable to comprehend information someone was telling him and he passed out, he hit the back of his head that required 7-8 staples. He was unable to remember how long he was not responsive approximately one to 2 minutes. He reports his coworkers said he was trying to fight them. He did not know his birthday upon awakening. He denies any tongue biting, incontinence of bowel or bladder or convulsive activity. Reports his hands were aching for about 2 days afterwards. His mom reports he seen a while the emergency department. It took him to do 3 days before he felt better.One-week prior to this episode he had an upper respiratory infection. Denies lack of sleep, poor diet Denies any muscle aches or feeling tired. He also describes a second episode about one month later of feeling lightheaded and difficulty comprehending but did not pass out. He was able to avoid passing out. He has a history of alcohol and drug abuse, continues to drink approximately 4-8 beers per day. He denies interruptions of his drinking pattern. He reports he has frequent staring off spells but denies is able to respond with someone speaks to him. He has had multiple deja vu episodes. Denies shortness of breath, chest pain but has fast heart beat when he becomes anxious. Sleeps approximately 7 hours per night. His mother and his fraternal twin brother both have a history of epilepsy (grand mal seizures) at the age of 36 years old.   REVIEW OF SYSTEMS: Out of a complete 14 system review of symptoms, the patient complains only of the following symptoms, none and all other reviewed systems are negative.  ALLERGIES: No Known Allergies  HOME MEDICATIONS: Outpatient Medications Prior to Visit  Medication Sig Dispense Refill    levETIRAcetam (KEPPRA) 500 MG tablet TAKE  (1)  TABLET TWICE A DAY. 60 tablet 0   No facility-administered medications prior to visit.     PAST MEDICAL HISTORY: Past Medical History:  Diagnosis Date   Hypertension    Seizures (HCC)    12/23/2014 no sz activity in past year    PAST SURGICAL HISTORY: Past Surgical History:  Procedure Laterality Date   ROTATOR CUFF REPAIR     SHOULDER SURGERY     both    FAMILY HISTORY: Family History  Problem Relation Age of Onset   Epilepsy Mother    Epilepsy Brother    Alzheimer's disease Unknown        Grandfather   Transient ischemic attack Unknown        Grandmother    SOCIAL HISTORY: Social History   Socioeconomic History   Marital status: Single    Spouse name: Not on file   Number of children: 2   Years of education:  HS   Highest education level: Not on file  Occupational History   Occupation: Environmental education officerWarehouse    Employer: Designer, [employer]  Social Needs   Financial resource strain: Not on file   Food insecurity    Worry: Not on file    Inability: Not on file   Transportation needs    Medical: Not on file    Non-medical: Not on file  Tobacco Use   Smoking status: Former Smoker    Packs/day: 0.50    Years: 14.00    Pack years: 7.00    Quit date: 09/26/2015    Years since quitting: 3.3   Smokeless tobacco: Current User    Types: Snuff   Tobacco comment: 12/27/15 dip  occasionally  Substance and Sexual Activity   Alcohol use: Yes    Alcohol/week: 7.0 standard drinks    Types: 7 Cans of beer per week    Comment: 1 beer per day   Drug use: No   Sexual activity: Yes  Lifestyle   Physical activity    Days per week: Not on file    Minutes per session: Not on file   Stress: Not on file  Relationships   Social connections    Talks on phone: Not on file    Gets together: Not on file    Attends religious service: Not on file    Active member of club or organization: Not on file    Attends meetings of  clubs or organizations: Not on file    Relationship status: Not on file   Intimate partner violence    Fear of current or ex partner: Not on file    Emotionally abused: Not on file    Physically abused: Not on file    Forced sexual activity: Not on file  Other Topics Concern   Not on file  Social History Narrative   Pt lives at home with his girlfriend.   Caffeine Use: very little      PHYSICAL EXAM  Vitals:   01/22/19 1503  BP: (!) 158/84  Pulse: 88  Temp: 98.6 F (37 C)  Weight: 190 lb (86.2 kg)  Height: 5' 11.5" (1.816 m)   Body mass index is 26.13 kg/m.  Generalized: Well developed, in no acute distress  Cardiology: normal rate and rhythm, no murmur noted Neurological examination  Mentation: Alert oriented to time, place, history taking. Follows all commands speech and language fluent Cranial nerve II-XII: Pupils were equal round reactive to light. Extraocular movements were full, visual field were full on confrontational test. Facial sensation and strength were normal. Uvula tongue midline. Head turning and shoulder shrug  were normal and symmetric. Motor: The motor testing reveals 5 over 5 strength of all 4 extremities. Good symmetric motor tone is noted throughout.  Coordination: Cerebellar testing reveals good finger-nose-finger and heel-to-shin bilaterally.  Gait and station: Gait is normal.   DIAGNOSTIC DATA (LABS, IMAGING, TESTING) - I reviewed patient records, labs, notes, testing and imaging myself where available.  No flowsheet data found.   Lab Results  Component Value Date   WBC 4.9 07/14/2013   HGB 14.7 07/14/2013   HCT 44.8 07/14/2013   MCV 87.6 07/14/2013   PLT 205 06/10/2011      Component Value Date/Time   NA 141 07/14/2013 1126   K 4.1 07/14/2013 1126   CL 101 07/14/2013 1126   CO2 22 07/14/2013 1126   GLUCOSE 79 07/14/2013 1126   GLUCOSE 99 06/10/2011 1016   BUN 17  07/14/2013 1126   CREATININE 0.81 07/14/2013 1126   CALCIUM 9.4  07/14/2013 1126   PROT 6.4 07/14/2013 1126   ALBUMIN 4.7 07/14/2013 1126   AST 19 07/14/2013 1126   ALT 17 07/14/2013 1126   ALKPHOS 76 07/14/2013 1126   BILITOT 0.6 07/14/2013 1126   GFRNONAA 119 07/14/2013 1126   GFRAA 138 07/14/2013 1126   Lab Results  Component Value Date   CHOL 174 07/14/2013   HDL 87 07/14/2013   LDLCALC 79 07/14/2013   TRIG 38 07/14/2013   CHOLHDL 2.0 07/14/2013   No results found for: HGBA1C No results found for: VITAMINB12 Lab Results  Component Value Date   TSH 1.410 07/14/2013    ASSESSMENT AND PLAN 38 y.o. year old male  has a past medical history of Hypertension and Seizures (HCC). here with     ICD-10-CM   1. Seizure disorder (HCC)  G40.909     Casimiro NeedleMichael is tolerating levetiracetam.  Unfortunately, he is having difficulty remembering the evening dose.  He feels that he would be more compliant with once daily dosing.  I have called in insulin. levetiracetam XR 1000 mg daily.  He was instructed to take 2 tablets once daily.  Seizure precautions advised.  He will follow-up with me in 1 year, sooner if needed.  He verbalizes understanding and agreement with this plan.   No orders of the defined types were placed in this encounter.    Meds ordered this encounter  Medications   levETIRAcetam (KEPPRA XR) 500 MG 24 hr tablet    Sig: Take 2 tablets (1,000 mg total) by mouth daily.    Dispense:  180 tablet    Refill:  3    Order Specific Question:   Supervising Provider    Answer:   Anson FretAHERN, ANTONIA B [id number]]      I spent 15 minutes with the patient. 50% of this time was spent counseling and educating patient on plan of care and medications.    Shawnie Dappermy Lycan Davee, FNP-C 01/22/2019, 4:24 PM [facility] [address], [address] [place], [place] [address] [phone]

## 2019-01-22 NOTE — Telephone Encounter (Signed)
Spoke to Stu at [employer].  He stated that keppra XR is on backorder, but also $52.00 per month for pt with no insurance.  (I relayed that was most likely a compliance issue).  He would speak to pt and if he is ok to pay then will try to find for him otherwise if he cannot afford this will give him what he was on before.  I relayed that was ok, but if any different would call back.

## 2019-01-22 NOTE — Telephone Encounter (Signed)
Noted  

## 2019-01-22 NOTE — Patient Instructions (Signed)
We will change levetiracetam to extended release. Start 2 tablet (1000mg  mg) daily   Follow up in 1 year, sooner if needed    Seizure, Adult A seizure is a sudden burst of abnormal electrical activity in the brain. Seizures usually last from 30 seconds to 2 minutes. They can cause many different symptoms. Usually, seizures are not harmful unless they last a long time. What are the causes? Common causes of this condition include:  Fever or infection.  Conditions that affect the brain, such as: ? A brain abnormality that you were born with. ? A brain or head injury. ? Bleeding in the brain. ? A tumor. ? Stroke. ? Brain disorders such as autism or cerebral palsy.  Low blood sugar.  Conditions that are passed from parent to child (are inherited).  Problems with substances, such as: ? Having a reaction to a drug or a medicine. ? Suddenly stopping the use of a substance (withdrawal). In some cases, the cause may not be known. A person who has repeated seizures over time without a clear cause has a condition called epilepsy. What increases the risk? You are more likely to get this condition if you have:  A family history of epilepsy.  Had a seizure in the past.  A brain disorder.  A history of head injury, lack of oxygen at birth, or strokes. What are the signs or symptoms? There are many types of seizures. The symptoms vary depending on the type of seizure you have. Examples of symptoms during a seizure include:  Shaking (convulsions).  Stiffness in the body.  Passing out (losing consciousness).  Head nodding.  Staring.  Not responding to sound or touch.  Loss of bladder control and bowel control. Some people have symptoms right before and right after a seizure happens. Symptoms before a seizure may include:  Fear.  Worry (anxiety).  Feeling like you may vomit (nauseous).  Feeling like the room is spinning (vertigo).  Feeling like you saw or heard  something before (dj vu).  Odd tastes or smells.  Changes in how you see. You may see flashing lights or spots. Symptoms after a seizure happens can include:  Confusion.  Sleepiness.  Headache.  Weakness on one side of the body. How is this treated? Most seizures will stop on their own in under 5 minutes. In these cases, no treatment is needed. Seizures that last longer than 5 minutes will usually need treatment. Treatment can include:  Medicines given through an IV tube.  Avoiding things that are known to cause your seizures. These can include medicines that you take for another condition.  Medicines to treat epilepsy.  Surgery to stop the seizures. This may be needed if medicines do not help. Follow these instructions at home: Medicines  Take over-the-counter and prescription medicines only as told by your doctor.  Do not eat or drink anything that may keep your medicine from working, such as alcohol. Activity  Do not do any activities that would be dangerous if you had another seizure, like driving or swimming. Wait until your doctor says it is safe for you to do them.  If you live in the [place] ask your local DMV (department of motor vehicles) when you can drive.  Get plenty of rest. Teaching others Teach friends and family what to do when you have a seizure. They should:  Lay you on the ground.  Protect your head and body.  Loosen any tight clothing around your neck.  Turn you  on your side.  Not hold you down.  Not put anything into your mouth.  Know whether or not you need emergency care.  Stay with you until you are better.  General instructions  Contact your doctor each time you have a seizure.  Avoid anything that gives you seizures.  Keep a seizure diary. Write down: ? What you think caused each seizure. ? What you remember about each seizure.  Keep all follow-up visits as told by your doctor. This is important. Contact a doctor if:  You  have another seizure.  You have seizures more often.  There is any change in what happens during your seizures.  You keep having seizures with treatment.  You have symptoms of being sick or having an infection. Get help right away if:  You have a seizure that: ? Lasts longer than 5 minutes. ? Is different than seizures you had before. ? Makes it harder to breathe. ? Happens after you hurt your head.  You have any of these symptoms after a seizure: ? Not being able to speak. ? Not being able to use a part of your body. ? Confusion. ? A bad headache.  You have two or more seizures in a row.  You do not wake up right after a seizure.  You get hurt during a seizure. These symptoms may be an emergency. Do not wait to see if the symptoms will go away. Get medical help right away. Call your local emergency services (911 in the [place] Do not drive yourself to the hospital. Summary  Seizures usually last from 30 seconds to 2 minutes. Usually, they are not harmful unless they last a long time.  Do not eat or drink anything that may keep your medicine from working, such as alcohol.  Teach friends and family what to do when you have a seizure.  Contact your doctor each time you have a seizure. This information is not intended to replace advice given to you by your health care provider. Make sure you discuss any questions you have with your health care provider. Document Released: 10/25/2007 Document Revised: 07/26/2018 Document Reviewed: 07/26/2018 Elsevier Patient Education  [place]. Levetiracetam extended-release tablets What is this medicine? LEVETIRACETAM (lee ve tye RA se tam) is an antiepileptic drug. It is used with other medicines to treat certain types of seizures. This medicine may be used for other purposes; ask your health care provider or pharmacist if you have questions. COMMON BRAND NAME(S): Keppra XR, Roweepra What should I tell my health care provider  before I take this medicine? They need to know if you have any of these conditions:  kidney disease  suicidal thoughts, plans, or attempt; a previous suicide attempt by you or a family member  an unusual or allergic reaction to levetiracetam, other medicines, foods, dyes, or preservatives  pregnant or trying to get pregnant  breast-feeding How should I use this medicine? Take this medicine by mouth with a glass of water. Follow the directions on the prescription label. Do not cut, crush or chew this medicine. You may take this medicine with or without food. Take your doses at regular intervals. Do not take your medicine more often than directed. Do not stop taking this medicine or any of your seizure medicines unless instructed by your doctor or health care professional. Stopping your medicine suddenly can increase your seizures or their severity. A special MedGuide will be given to you by the pharmacist with each prescription and refill. Be  sure to read this information carefully each time. Contact your pediatrician or health care professional regarding the use of this medication in children. While this drug may be prescribed for children as young as 36 years of age for selected conditions, precautions do apply. Overdosage: If you think you have taken too much of this medicine contact a poison control center or emergency room at once. NOTE: This medicine is only for you. Do not share this medicine with others. What if I miss a dose? If you miss a dose and it has only been a few hours, take it as soon as you can. If it is almost time for your next dose, take only that dose. Do not take double or extra doses. What may interact with this medicine? This medicine may interact with the following medications:  carbamazepine  colesevelam  probenecid  sevelamer This list may not describe all possible interactions. Give your health care provider a list of all the medicines, herbs,  non-prescription drugs, or dietary supplements you use. Also tell them if you smoke, drink alcohol, or use illegal drugs. Some items may interact with your medicine. What should I watch for while using this medicine? Visit your doctor or health care provider for a regular check on your progress. Wear a medical identification bracelet or chain to say you have epilepsy, and carry a card that lists all your medications. This medicine may cause serious skin reactions. They can happen weeks to months after starting the medicine. Contact your health care provider right away if you notice fevers or flu-like symptoms with a rash. The rash may be red or purple and then turn into blisters or peeling of the skin. Or, you might notice a red rash with swelling of the face, lips or lymph nodes in your neck or under your arms. It is important to take this medicine exactly as instructed by your health care provider. When first starting treatment, your dose may need to be adjusted. It may take weeks or months before your dose is stable. You should contact your doctor or health care provider if your seizures get worse or if you have any new types of seizures. You may get drowsy or dizzy. Do not drive, use machinery, or do anything that needs mental alertness until you know how this medicine affects you. Do not stand or sit up quickly, especially if you are an older patient. This reduces the risk of dizzy or fainting spells. Alcohol may interfere with the effect of this medicine. Avoid alcoholic drinks. The use of this medicine may increase the chance of suicidal thoughts or actions. Pay special attention to how you are responding while on this medicine. Any worsening of mood, or thoughts of suicide or dying should be reported to your health care provider right away. The tablet shell for some brands of this medicine does not dissolve. This is normal. The tablet shell may appear in the stool. This is not cause for  concern. Women who become pregnant while using this medicine may enroll in the [place] Antiepileptic Drug Pregnancy Registry by calling [phone]. This registry collects information about the safety of antiepileptic drug use during pregnancy. What side effects may I notice from receiving this medicine? Side effects that you should report to your doctor or health care professional as soon as possible:  allergic reactions like skin rash, itching or hives, swelling of the face, lips, or tongue  breathing problems  changes in emotions or moods  dark urine  general ill feeling or flu-like symptoms  problems with balance, talking, walking  rash, fever, and swollen lymph nodes  redness, blistering, peeling or loosening of the skin, including inside the mouth  suicidal thoughts or actions  unusually weak or tired  yellowing of the eyes or skin Side effects that usually do not require medical attention (report to your doctor or health care professional if they continue or are bothersome):  diarrhea  dizzy, drowsy  headache  loss of appetite This list may not describe all possible side effects. Call your doctor for medical advice about side effects. You may report side effects to FDA at 1-800-FDA-1088. Where should I keep my medicine? Keep out of reach of children. Store at room temperature between 15 and 30 degrees C (59 and 86 degrees F). Throw away any unused medicine after the expiration date. NOTE: This sheet is a summary. It may not cover all possible information. If you have questions about this medicine, talk to your doctor, pharmacist, or health care provider.  2020 Elsevier/Gold Standard (2018-08-09 15:14:16)

## 2019-01-22 NOTE — Telephone Encounter (Signed)
Yes, that is fine. We discussed this in the office as well. I know [facility] has it but may be more expensive there.

## 2019-01-28 NOTE — Progress Notes (Signed)
I reviewed note and agree with plan.   VIKRAM R. PENUMALLI, MD 01/28/2019, 11:52 AM Certified in Neurology, Neurophysiology and Neuroimaging  [facility] [address], [address] [place], [place] [address] [phone]  

## 2020-01-28 ENCOUNTER — Other Ambulatory Visit: Payer: Self-pay

## 2020-01-28 ENCOUNTER — Ambulatory Visit: Payer: BC Managed Care – PPO | Admitting: Family Medicine

## 2020-01-28 ENCOUNTER — Encounter: Payer: Self-pay | Admitting: Family Medicine

## 2020-01-28 VITALS — BP 130/70 | HR 70 | Ht 71.5 in | Wt 190.8 lb

## 2020-01-28 DIAGNOSIS — G40909 Epilepsy, unspecified, not intractable, without status epilepticus: Secondary | ICD-10-CM | POA: Diagnosis not present

## 2020-01-28 MED ORDER — LEVETIRACETAM ER 500 MG PO TB24: 1000.0000 mg | ORAL_TABLET | Freq: Every day | ORAL | 3 refills | Status: AC

## 2020-01-28 NOTE — Progress Notes (Signed)
PATIENT: Ray Torres DOB: 1981/03/07  REASON FOR VISIT: follow up HISTORY FROM: patient  Chief Complaint  Patient presents with  . Follow-up    15yr f/u for seizures. Denies any new seizures.   . room 4    alone     HISTORY OF PRESENT ILLNESS: Today 01/28/20 Ray Torres is a 39 y.o. male here today for follow up for seizures. He continues to tolerate levetiracetam. He reports cost was a concern with XR dosing at last visit so he switched back to IR dosing. He admits that he misses the evening dose at least 3-4 times a week. Fortunately, he has not had any seizure activity. He is working full time and driving without difficulty. He feels he will be more compliant with XR daily dosing.    HISTORY: (copied from my note on 01/22/2019)  Ray Torres is a 39 y.o. male here today for follow up of seizure disorder. He continues levetiracetam 500mg  twice daily.  He admits that he is having difficulty remembering the second dose.  He is pretty consistent with taking morning dose daily but is missing the evening dose several times weekly.  He denies any seizure activity.  He is tolerating medication well otherwise.  He has cut back on alcohol intake.  He does not drink every day.  HISTORY: (copied from note on 01/08/2018)  Mr. Ray Torres is a 39 year old male with a history of seizures. He returns today for follow-up. He remains on Keppra 500 mg twice a day. He denies any seizure events. He operates a 31 without difficulty. He reports that he drinks approximately 6 beers a day and more on the weekends. He does acknowledge that he has a problem. Reports that when he was in his 46s he did go to rehab and was sober for 9 months. He states that he has a family history of alcoholism. The patient's blood pressure is slightly elevated today. He does have a primary care provider but does not see them regularly. He returns today for  evaluation.  Upate08/16/18MM: Mr. Ray Torres a 39 year old male with a history of seizure events. He returns today for follow-up. He is currently on Keppra taking 500 mg twice a day. He denies any seizure events. He operates a 20. Denies any changes with his gait or balance. No change in mood or behavior. Reports that he continues to have approximately 2 beers after work andunsure how many he has on the weekend. He denies any new neurological symptoms. He does report he has a tremor in the left arm after having shoulder surgery. He reports that this is not new but rather ongoing. He returns today for an evaluation.  UPDATE 12/27/15: Since last visit no sz. No new events. Tolerating LEV. BP still elevated. Drinking 2 beers per day after work; sometimes up to 6+ beers per day on the weekends. Has quit smoking.   UPDATE 12/23/14: Since last visit no sz. No new events. Tolerating LEV. Has cut down beer to 1 per day.  UPDATE 12/01/13 (LL): Since last visit, no further seizures. Tolerating LEV 500 mg bid well. No complaints.  UPDATE 10/30/12 (Ray Torres): since last visit, patient is doing well. No further seizures. Last seizure January 2013. Also since last visit, I have now started to take care of his twin brother for seizure disorder, previously treated by one of my former colleagues (Dr. February 2013).   UPDATE 05/03/12 (Ray Torres): Denies any seizure or passing out episodes. Tolerating  LEV 500mg  BID well. He continues to drink 6-8 beers per day. Has drank less in the last 2-3 days secondary to having a "cold".   UPDATE 01/02/12 (Ray Torres): Back at work since August 6th. After taking am medication has light headedness, he has been taking on empty stomach. Has been feeling lightheaded after awakening for about 1-2 hours then resolves. Tolerating LEV 500mg  BID.  Denies any episodes of seizures or passing out.   UPDATE 10/02/11 (Ray Torres): Doing well. Tolerating Lev 500mg  BID without drowsiness or nausea. Reports  taking his later in the day and felt light headed but did not pass out. Reports having tingling to left chest and hand intermittently, daily, this was occuring prior to his medications. He missed his appointment with the cardiologist and is rescheduled for May 31st. Currently out of work, he drives a . His short term/long term disability forms have been completed by [facility]. He continues to drink 4-8 alcoholic beverages per day. Last reported episode with passing out was 06/19/11.   PRIOR HPI (Ray Torres, clinical (histocompatibility and immunogenetics)): 39 year old right-handed male with no significant past medical history here for new onset dizziness and passing out episode. In January 2013 while at work he had an episode where he was unable to comprehend information someone was telling him and he passed out, he hit the back of his head that required 7-8 staples. He was unable to remember how long he was not responsive approximately one to 2 minutes. He reports his coworkers said he was trying to fight them. He did not know his birthday upon awakening. He denies any tongue biting, incontinence of bowel or bladder or convulsive activity. Reports his hands were aching for about 2 days afterwards. His mom reports he seen a while the emergency department. It took him to do 3 days before he felt better.One-week prior to this episode he had an upper respiratory infection. Denies lack of sleep, poor diet Denies any muscle aches or feeling tired. He also describes a second episode about one month later of feeling lightheaded and difficulty comprehending but did not pass out. He was able to avoid passing out. He has a history of alcohol and drug abuse, continues to drink approximately 4-8 beers per day. He denies interruptions of his drinking pattern. He reports he has frequent staring off spells but denies is able to respond with someone speaks to him. He has had multiple deja vu episodes. Denies shortness of breath, chest pain but has fast  heart beat when he becomes anxious. Sleeps approximately 7 hours per night. His mother and his fraternal twin brother both have a history of epilepsy (grand mal seizures) at the age of 37 years old.    REVIEW OF SYSTEMS: Out of a complete 14 system review of symptoms, the patient complains only of the following symptoms, seizure and all other reviewed systems are negative.   ALLERGIES: No Known Allergies  HOME MEDICATIONS: Outpatient Medications Prior to Visit  Medication Sig Dispense Refill  . levETIRAcetam (KEPPRA XR) 500 MG 24 hr tablet Take 2 tablets (1,000 mg total) by mouth daily. 180 tablet 3   No facility-administered medications prior to visit.    PAST MEDICAL HISTORY: Past Medical History:  Diagnosis Date  . Hypertension   . Seizures (HCC)    12/23/2014 no sz activity in past year    PAST SURGICAL HISTORY: Past Surgical History:  Procedure Laterality Date  . ROTATOR CUFF REPAIR    . SHOULDER SURGERY     both  FAMILY HISTORY: Family History  Problem Relation Age of Onset  . Epilepsy Mother   . Epilepsy Brother   . Alzheimer's disease Unknown        Grandfather  . Transient ischemic attack Unknown        Grandmother    SOCIAL HISTORY: Social History   Socioeconomic History  . Marital status: Single    Spouse name: Not on file  . Number of children: 2  . Years of education: HS  . Highest education level: Not on file  Occupational History  . Occupation: Environmental education officerWarehouse    Employer: [employer]  Tobacco Use  . Smoking status: Former Smoker    Packs/day: 0.50    Years: 14.00    Pack years: 7.00    Quit date: 09/26/2015    Years since quitting: 4.3  . Smokeless tobacco: Current User    Types: Snuff  . Tobacco comment: 12/27/15 dip  occasionally  Substance and Sexual Activity  . Alcohol use: Yes    Alcohol/week: 7.0 standard drinks    Types: 7 Cans of beer per week    Comment: 1 beer per day  . Drug use: No  . Sexual activity: Yes  Other Topics  Concern  . Not on file  Social History Narrative   Pt lives at home with his girlfriend.   Caffeine Use: very little   Social Determinants of Health   Financial Resource Strain:   . Difficulty of Paying Living Expenses: Not on file  Food Insecurity:   . Worried About Programme researcher, broadcasting/film/videounning Out of Food in the Last Year: Not on file  . Ran Out of Food in the Last Year: Not on file  Transportation Needs:   . Lack of Transportation (Medical): Not on file  . Lack of Transportation (Non-Medical): Not on file  Physical Activity:   . Days of Exercise per Week: Not on file  . Minutes of Exercise per Session: Not on file  Stress:   . Feeling of Stress : Not on file  Social Connections:   . Frequency of Communication with Friends and Family: Not on file  . Frequency of Social Gatherings with Friends and Family: Not on file  . Attends Religious Services: Not on file  . Active Member of Clubs or Organizations: Not on file  . Attends BankerClub or Organization Meetings: Not on file  . Marital Status: Not on file  Intimate Partner Violence:   . Fear of Current or Ex-Partner: Not on file  . Emotionally Abused: Not on file  . Physically Abused: Not on file  . Sexually Abused: Not on file      PHYSICAL EXAM  Vitals:   01/28/20 1536  BP: 130/70  Pulse: 70  Weight: 190 lb 12.8 oz (86.5 kg)  Height: 5' 11.5" (1.816 m)   Body mass index is 26.24 kg/m.  Generalized: Well developed, in no acute distress  Cardiology: normal rate and rhythm, no murmur noted Respiratory: clear to auscultation bilaterally  Neurological examination  Mentation: Alert oriented to time, place, history taking. Follows all commands speech and language fluent Cranial nerve II-XII: Pupils were equal round reactive to light. Extraocular movements were full, visual field were full  Motor: The motor testing reveals 5 over 5 strength of all 4 extremities. Good symmetric motor tone is noted throughout.  Gait and station: Gait is normal.     DIAGNOSTIC DATA (LABS, IMAGING, TESTING) - I reviewed patient records, labs, notes, testing and imaging myself where available.  No  flowsheet data found.   Lab Results  Component Value Date   WBC 4.9 07/14/2013   HGB 14.7 07/14/2013   HCT 44.8 07/14/2013   MCV 87.6 07/14/2013   PLT 205 06/10/2011      Component Value Date/Time   NA 141 07/14/2013 1126   K 4.1 07/14/2013 1126   CL 101 07/14/2013 1126   CO2 22 07/14/2013 1126   GLUCOSE 79 07/14/2013 1126   GLUCOSE 99 06/10/2011 1016   BUN 17 07/14/2013 1126   CREATININE 0.81 07/14/2013 1126   CALCIUM 9.4 07/14/2013 1126   PROT 6.4 07/14/2013 1126   ALBUMIN 4.7 07/14/2013 1126   AST 19 07/14/2013 1126   ALT 17 07/14/2013 1126   ALKPHOS 76 07/14/2013 1126   BILITOT 0.6 07/14/2013 1126   GFRNONAA 119 07/14/2013 1126   GFRAA 138 07/14/2013 1126   Lab Results  Component Value Date   CHOL 174 07/14/2013   HDL 87 07/14/2013   LDLCALC 79 07/14/2013   TRIG 38 07/14/2013   CHOLHDL 2.0 07/14/2013   No results found for: HGBA1C No results found for: VITAMINB12 Lab Results  Component Value Date   TSH 1.410 07/14/2013       ASSESSMENT AND PLAN 39 y.o. year old male  has a past medical history of Hypertension and Seizures (HCC). here with     ICD-10-CM   1. Seizure disorder (HCC)  G40.909     Dary continues to do well on levetiracetam. He often misses evening dose and request to switch to extended release dosing. He will take levetiracetam XR 1000mg  daily in the mornings. He will call with any questions or concerns. Healthy lifestyle habits encouraged. He was advised to avoid excessive alcohol intake. Regular CPE with PCP advised. He will follow up in 1 year, sooner if needed.    No orders of the defined types were placed in this encounter.    Meds ordered this encounter  Medications  . levETIRAcetam (KEPPRA XR) 500 MG 24 hr tablet    Sig: Take 2 tablets (1,000 mg total) by mouth daily.    Dispense:  180  tablet    Refill:  3    Order Specific Question:   Supervising Provider    Answer:   Anson Fret      I spent 15 minutes with the patient. 50% of this time was spent counseling and educating patient on plan of care and medications.    [id number], FNP-C 01/28/2020, 4:26 PM [facility] [address] [place], [place] [phone]

## 2020-01-28 NOTE — Patient Instructions (Signed)
We will continue levetiracetam. I will call in XR 500mg  tablets to your pharmacy. Take 2 tablets once daily. I think this will help with medication compliance.   Alcohol in moderation. Stay well hydrated. I recommend yearly CPE with PCP.  Follow up in 1 year    Seizure, Adult A seizure is a sudden burst of abnormal electrical activity in the brain. Seizures usually last from 30 seconds to 2 minutes. They can cause many different symptoms. Usually, seizures are not harmful unless they last a long time. What are the causes? Common causes of this condition include:  Fever or infection.  Conditions that affect the brain, such as: ? A brain abnormality that you were born with. ? A brain or head injury. ? Bleeding in the brain. ? A tumor. ? Stroke. ? Brain disorders such as autism or cerebral palsy.  Low blood sugar.  Conditions that are passed from parent to child (are inherited).  Problems with substances, such as: ? Having a reaction to a drug or a medicine. ? Suddenly stopping the use of a substance (withdrawal). In some cases, the cause may not be known. A person who has repeated seizures over time without a clear cause has a condition called epilepsy. What increases the risk? You are more likely to get this condition if you have:  A family history of epilepsy.  Had a seizure in the past.  A brain disorder.  A history of head injury, lack of oxygen at birth, or strokes. What are the signs or symptoms? There are many types of seizures. The symptoms vary depending on the type of seizure you have. Examples of symptoms during a seizure include:  Shaking (convulsions).  Stiffness in the body.  Passing out (losing consciousness).  Head nodding.  Staring.  Not responding to sound or touch.  Loss of bladder control and bowel control. Some people have symptoms right before and right after a seizure happens. Symptoms before a seizure may include:  Fear.  Worry  (anxiety).  Feeling like you may vomit (nauseous).  Feeling like the room is spinning (vertigo).  Feeling like you saw or heard something before (dj vu).  Odd tastes or smells.  Changes in how you see. You may see flashing lights or spots. Symptoms after a seizure happens can include:  Confusion.  Sleepiness.  Headache.  Weakness on one side of the body. How is this treated? Most seizures will stop on their own in under 5 minutes. In these cases, no treatment is needed. Seizures that last longer than 5 minutes will usually need treatment. Treatment can include:  Medicines given through an IV tube.  Avoiding things that are known to cause your seizures. These can include medicines that you take for another condition.  Medicines to treat epilepsy.  Surgery to stop the seizures. This may be needed if medicines do not help. Follow these instructions at home: Medicines  Take over-the-counter and prescription medicines only as told by your doctor.  Do not eat or drink anything that may keep your medicine from working, such as alcohol. Activity  Do not do any activities that would be dangerous if you had another seizure, like driving or swimming. Wait until your doctor says it is safe for you to do them.  If you live in the [place] ask your local DMV (department of motor vehicles) when you can drive.  Get plenty of rest. Teaching others Teach friends and family what to do when you have a seizure. They  should:  Lay you on the ground.  Protect your head and body.  Loosen any tight clothing around your neck.  Turn you on your side.  Not hold you down.  Not put anything into your mouth.  Know whether or not you need emergency care.  Stay with you until you are better.  General instructions  Contact your doctor each time you have a seizure.  Avoid anything that gives you seizures.  Keep a seizure diary. Write down: ? What you think caused each seizure. ? What  you remember about each seizure.  Keep all follow-up visits as told by your doctor. This is important. Contact a doctor if:  You have another seizure.  You have seizures more often.  There is any change in what happens during your seizures.  You keep having seizures with treatment.  You have symptoms of being sick or having an infection. Get help right away if:  You have a seizure that: ? Lasts longer than 5 minutes. ? Is different than seizures you had before. ? Makes it harder to breathe. ? Happens after you hurt your head.  You have any of these symptoms after a seizure: ? Not being able to speak. ? Not being able to use a part of your body. ? Confusion. ? A bad headache.  You have two or more seizures in a row.  You do not wake up right after a seizure.  You get hurt during a seizure. These symptoms may be an emergency. Do not wait to see if the symptoms will go away. Get medical help right away. Call your local emergency services (911 in the [place] Do not drive yourself to the hospital. Summary  Seizures usually last from 30 seconds to 2 minutes. Usually, they are not harmful unless they last a long time.  Do not eat or drink anything that may keep your medicine from working, such as alcohol.  Teach friends and family what to do when you have a seizure.  Contact your doctor each time you have a seizure. This information is not intended to replace advice given to you by your health care provider. Make sure you discuss any questions you have with your health care provider. Document Revised: 07/26/2018 Document Reviewed: 07/26/2018 Elsevier Patient Education  2020 [employer].

## 2020-02-24 NOTE — Progress Notes (Signed)
I reviewed note and agree with plan.   Benedicto Capozzi R. Meline Russaw, MD 02/24/2020, 1:10 PM Certified in Neurology, Neurophysiology and Neuroimaging  [facility] [address], [address] [place], [place] [address] [phone]  

## 2021-01-27 ENCOUNTER — Ambulatory Visit: Payer: BC Managed Care – PPO | Admitting: Family Medicine
